# Patient Record
Sex: Female | Born: 1945 | Race: Black or African American | Hispanic: No | Smoking: Former smoker
Health system: Southern US, Community
[De-identification: ages and names within clinical notes are randomized; demographics above are authoritative.]

## PROBLEM LIST (undated history)

## (undated) DIAGNOSIS — I5022 Chronic systolic (congestive) heart failure: Secondary | ICD-10-CM

## (undated) DIAGNOSIS — I10 Essential (primary) hypertension: Secondary | ICD-10-CM

## (undated) DIAGNOSIS — E785 Hyperlipidemia, unspecified: Secondary | ICD-10-CM

## (undated) DIAGNOSIS — F329 Major depressive disorder, single episode, unspecified: Secondary | ICD-10-CM

## (undated) DIAGNOSIS — D259 Leiomyoma of uterus, unspecified: Secondary | ICD-10-CM

## (undated) DIAGNOSIS — I2109 ST elevation (STEMI) myocardial infarction involving other coronary artery of anterior wall: Secondary | ICD-10-CM

## (undated) DIAGNOSIS — H9192 Unspecified hearing loss, left ear: Secondary | ICD-10-CM

## (undated) DIAGNOSIS — I255 Ischemic cardiomyopathy: Secondary | ICD-10-CM

## (undated) DIAGNOSIS — I639 Cerebral infarction, unspecified: Secondary | ICD-10-CM

## (undated) DIAGNOSIS — F32A Depression, unspecified: Secondary | ICD-10-CM

## (undated) HISTORY — DX: Cerebral infarction, unspecified: I63.9

## (undated) HISTORY — PX: TONSILLECTOMY: SUR1361

## (undated) HISTORY — DX: Ischemic cardiomyopathy: I25.5

## (undated) HISTORY — DX: Leiomyoma of uterus, unspecified: D25.9

## (undated) HISTORY — PX: CHOLECYSTECTOMY: SHX55

## (undated) HISTORY — PX: CARDIAC DEFIBRILLATOR PLACEMENT: SHX171

## (undated) HISTORY — PX: ABDOMINAL HYSTERECTOMY: SHX81

## (undated) HISTORY — PX: APPENDECTOMY: SHX54

## (undated) HISTORY — DX: Hyperlipidemia, unspecified: E78.5

## (undated) HISTORY — DX: Essential (primary) hypertension: I10

---

## 1998-06-17 DIAGNOSIS — I2109 ST elevation (STEMI) myocardial infarction involving other coronary artery of anterior wall: Secondary | ICD-10-CM

## 1998-06-17 HISTORY — DX: ST elevation (STEMI) myocardial infarction involving other coronary artery of anterior wall: I21.09

## 2005-05-17 DIAGNOSIS — I255 Ischemic cardiomyopathy: Secondary | ICD-10-CM

## 2005-05-17 HISTORY — DX: Ischemic cardiomyopathy: I25.5

## 2005-05-21 ENCOUNTER — Ambulatory Visit: Payer: Self-pay | Admitting: Internal Medicine

## 2005-05-28 ENCOUNTER — Ambulatory Visit: Payer: Self-pay | Admitting: Internal Medicine

## 2005-05-28 ENCOUNTER — Inpatient Hospital Stay (HOSPITAL_COMMUNITY): Admission: RE | Admit: 2005-05-28 | Discharge: 2005-05-30 | Payer: Self-pay | Admitting: Internal Medicine

## 2005-06-05 ENCOUNTER — Ambulatory Visit: Payer: Self-pay

## 2005-10-01 ENCOUNTER — Ambulatory Visit: Payer: Self-pay | Admitting: Internal Medicine

## 2006-05-19 ENCOUNTER — Ambulatory Visit: Payer: Self-pay

## 2006-08-04 ENCOUNTER — Ambulatory Visit: Payer: Self-pay | Admitting: Internal Medicine

## 2007-02-20 ENCOUNTER — Ambulatory Visit: Payer: Self-pay | Admitting: Internal Medicine

## 2007-05-01 ENCOUNTER — Ambulatory Visit: Payer: Self-pay | Admitting: Cardiovascular Disease

## 2007-05-01 ENCOUNTER — Inpatient Hospital Stay (HOSPITAL_COMMUNITY): Admission: RE | Admit: 2007-05-01 | Discharge: 2007-05-05 | Payer: Self-pay | Admitting: Pediatrics

## 2007-05-01 ENCOUNTER — Encounter (INDEPENDENT_AMBULATORY_CARE_PROVIDER_SITE_OTHER): Payer: Self-pay | Admitting: Pediatrics

## 2007-07-21 ENCOUNTER — Ambulatory Visit: Payer: Self-pay | Admitting: Internal Medicine

## 2007-10-20 ENCOUNTER — Ambulatory Visit: Payer: Self-pay | Admitting: Internal Medicine

## 2008-01-19 ENCOUNTER — Ambulatory Visit: Payer: Self-pay | Admitting: Internal Medicine

## 2008-04-19 ENCOUNTER — Ambulatory Visit: Payer: Self-pay | Admitting: Internal Medicine

## 2008-07-26 ENCOUNTER — Encounter: Payer: Self-pay | Admitting: Internal Medicine

## 2008-08-09 ENCOUNTER — Ambulatory Visit: Payer: Self-pay | Admitting: Internal Medicine

## 2008-11-08 ENCOUNTER — Ambulatory Visit: Payer: Self-pay | Admitting: Internal Medicine

## 2008-12-02 ENCOUNTER — Encounter: Payer: Self-pay | Admitting: Internal Medicine

## 2009-02-24 ENCOUNTER — Encounter: Payer: Self-pay | Admitting: Internal Medicine

## 2009-03-03 ENCOUNTER — Ambulatory Visit: Payer: Self-pay | Admitting: Internal Medicine

## 2009-03-10 ENCOUNTER — Encounter: Payer: Self-pay | Admitting: Internal Medicine

## 2009-06-06 ENCOUNTER — Ambulatory Visit: Payer: Self-pay | Admitting: Internal Medicine

## 2009-06-06 ENCOUNTER — Encounter: Payer: Self-pay | Admitting: Internal Medicine

## 2009-06-19 ENCOUNTER — Telehealth: Payer: Self-pay | Admitting: Internal Medicine

## 2009-06-20 ENCOUNTER — Encounter: Payer: Self-pay | Admitting: Internal Medicine

## 2009-07-21 DIAGNOSIS — I2589 Other forms of chronic ischemic heart disease: Secondary | ICD-10-CM

## 2009-07-21 DIAGNOSIS — I10 Essential (primary) hypertension: Secondary | ICD-10-CM

## 2009-07-21 DIAGNOSIS — E785 Hyperlipidemia, unspecified: Secondary | ICD-10-CM | POA: Insufficient documentation

## 2009-07-21 DIAGNOSIS — I255 Ischemic cardiomyopathy: Secondary | ICD-10-CM | POA: Insufficient documentation

## 2009-07-21 DIAGNOSIS — I2109 ST elevation (STEMI) myocardial infarction involving other coronary artery of anterior wall: Secondary | ICD-10-CM | POA: Insufficient documentation

## 2009-07-24 ENCOUNTER — Ambulatory Visit: Payer: Self-pay | Admitting: Internal Medicine

## 2009-07-24 DIAGNOSIS — Z95810 Presence of automatic (implantable) cardiac defibrillator: Secondary | ICD-10-CM

## 2009-10-24 ENCOUNTER — Ambulatory Visit: Payer: Self-pay | Admitting: Internal Medicine

## 2009-10-27 ENCOUNTER — Encounter: Payer: Self-pay | Admitting: Internal Medicine

## 2009-11-06 ENCOUNTER — Encounter: Payer: Self-pay | Admitting: Internal Medicine

## 2010-01-26 ENCOUNTER — Encounter (INDEPENDENT_AMBULATORY_CARE_PROVIDER_SITE_OTHER): Payer: Self-pay | Admitting: *Deleted

## 2010-01-26 ENCOUNTER — Telehealth: Payer: Self-pay | Admitting: Internal Medicine

## 2010-01-29 ENCOUNTER — Encounter: Payer: Self-pay | Admitting: Internal Medicine

## 2010-01-29 ENCOUNTER — Ambulatory Visit: Payer: Self-pay | Admitting: Internal Medicine

## 2010-02-05 ENCOUNTER — Encounter: Payer: Self-pay | Admitting: Internal Medicine

## 2010-05-03 ENCOUNTER — Ambulatory Visit: Payer: Self-pay | Admitting: Internal Medicine

## 2010-05-30 ENCOUNTER — Encounter: Payer: Self-pay | Admitting: Internal Medicine

## 2010-07-17 NOTE — Letter (Signed)
Summary: Device-Delinquent Phone Journalist, newspaper, Main Office  [address]   [place], [place] [address]   Phone: [phone]  Fax: [phone]     January 26, 2010 MRN: [id number]   [facility] [address] [place], [place]  [address]   Dear Ms. [facility],  According to our records, you were scheduled for a device phone transmission on        01/25/2010.                      .     We did not receive any results from this check.  If you transmitted on your scheduled day, please call us to help troubleshoot your system.  If you forgot to send your transmission, please send one upon receipt of this letter.  Thank you, Altha Harm, LPN  January 26, 2010 10:58 AM   [facility]

## 2010-07-17 NOTE — Letter (Signed)
Summary: Remote Device Check  [employer], Main Office  [address]   [place], [place] [address]   Phone: [phone]  Fax: [phone]     February 05, 2010 MRN: [id number]   [facility] [address] [place], [place]  [address]   Dear Ms. [facility],   Your remote transmission was recieved and reviewed by your physician.  All diagnostics were within normal limits for you.  __X___Your next transmission is scheduled for:  05-03-2010.  Please transmit at any time this day.  If you have a wireless device your transmission will be sent automatically.  ______Your next office visit is scheduled for:                              . Please call our office to schedule an appointment.    Sincerely,  Vella Kohler

## 2010-07-17 NOTE — Cardiovascular Report (Signed)
Summary: Office Visit Remote   Office Visit Remote   Imported By: Roderic Ovens 06/22/2009 12:43:20  _____________________________________________________________________  External Attachment:    Type:   Image     Comment:   External Document

## 2010-07-17 NOTE — Cardiovascular Report (Signed)
Summary: Office Visit Remote   Office Visit Remote   Imported By: Roderic Ovens 02/06/2010 15:52:25  _____________________________________________________________________  External Attachment:    Type:   Image     Comment:   External Document

## 2010-07-17 NOTE — Letter (Signed)
Summary: Remote Device Check  [employer], Main Office  [address]   [place], [place] [address]   Phone: [phone]  Fax: [phone]     Nov 06, 2009 MRN: [id number]   [facility] [address] [place], [place]  [address]   Dear Ms. [facility],   Your remote transmission was recieved and reviewed by your physician.  All diagnostics were within normal limits for you.  __X___Your next transmission is scheduled for:  01-25-2010.  Please transmit at any time this day.  If you have a wireless device your transmission will be sent automatically.   Sincerely,  Vella Kohler

## 2010-07-17 NOTE — Letter (Signed)
Summary: Remote Device Check  [employer], Main Office  [address]   [place], [place] [address]   Phone: [phone]  Fax: [phone]     June 20, 2009 MRN: [id number]   [facility] [address] [place], [place]  [address]   Dear Ms. [facility],   Your remote transmission was recieved and reviewed by your physician.  All diagnostics were within normal limits for you.   ___X___Your next office visit is scheduled for:     February 2011 with Dr Ladona Ridgel. Please call our office to schedule an appointment.    Sincerely,  Proofreader

## 2010-07-17 NOTE — Assessment & Plan Note (Signed)
Summary: 1 yr f/u   Visit Type:  Follow-up Primary Provider:  Charise Killian, MD   History of Present Illness: [name] returns today for followup.  She is a very pleasant middle- aged woman with an ischemic cardiomyopathy, congestive heart failure which has been mostly class I.  She also has a history of ICD implantation.  She had an atrial fibrillation but has not been thought to be a Coumadin candidate and for this reason, she is on Plavix.  She returns today for followup.  She denies chest pain.  She denies shortness of breath.  She does note that she has lost about 5 lbs since I last saw her.   Current Medications (verified): 1)  Plavix 75 Mg Tabs (Clopidogrel Bisulfate) .... Take One Tablet By Mouth Daily 2)  Digoxin 0.125 Mg Tabs (Digoxin) .... Take One Tablet By Mouth Daily 3)  Lotensin Hct 20-12.5 Mg Tabs (Benazepril-Hydrochlorothiazide) 4)  Simvastatin 80 Mg Tabs (Simvastatin) .... Take One Tablet By Mouth Daily At Bedtime 5)  Tramadol Hcl 50 Mg Tabs (Tramadol Hcl) .... 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  -ischemic cardiomyopathy with ejection fraction of 25% in       December 2006 - class I-II congestive heart failure in December       2006.  Hypertension  Dyslipidemia Congestive Heart Failure  Past Surgical History:  ICD  implantation December 2006.      Review of Systems       The patient complains of weight loss.  The patient denies chest pain, syncope, dyspnea on exertion, and peripheral edema.    Vital Signs:  Patient profile:   65 year old female Height:      61 inches Weight:      110 pounds BMI:     20.86 Pulse rate:   95 / minute BP sitting:   110 / 70  (left arm)  Vitals Entered By: [name] CMA (July 24, 2009 2:34 PM)  Physical Exam  General:  Well developed,  well nourished, in no acute distress.  HEENT: normal. Poor dentitittion. Neck: supple. No JVD. Carotids 2+ bilaterally no bruits Cor: RRR with a soft S3.  A 1/6 systolic murmur is present. Lungs: CTA. Well healed ICD incision. Ab: soft, nontender. nondistended. No HSM. Good bowel sounds Ext: warm. no cyanosis, clubbing or edema Neuro: alert and oriented. Grossly nonfocal. affect pleasant     ICD Specifications Following MD:  Lewayne Bunting, MD     ICD Vendor:  Medtronic     ICD Model Number:  [id number]     ICD Serial Number:  [id number] ICD DOI:  05/28/2005      Lead 1:    Location: RV     DOI: 05/28/2005     Model #: 1191     Serial #: [id number]     Status: active  Indications::  ICM  Explantation Comments: LIA Carelink  ICD Follow Up Remote Check?  No Battery Voltage:  2.59 V     Charge Time:  10.16 seconds     Underlying rhythm:  ST ICD Dependent:  No       ICD Device Measurements Right Ventricle:  Amplitude: 15 mV, Impedance: 564 ohms, Threshold: 1.0 V at 0.2 msec Shock Impedance: 17 ohms   Episodes Shock:  0  ATP:  0     Nonsustained:  3     Ventricular Pacing:  0%  Brady Parameters Mode VVI     Lower Rate Limit:  40      Tachy Zones VF:  200     VT:  250     VT1:  171     Next Remote Date:  10/24/2009     Next Cardiology Appt Due:  07/18/2010 Tech Comments:  Normal device function.  No LIA on Onyx.  [phone] lead stable today.  Pt does Carelink transmissions.  ROV 12 months GT. Gypsy Balsam RN BSN  July 24, 2009 2:54 PM  MD Comments:  Agree with above.  Impression & Recommendations:  Problem # 1:  AUTOMATIC IMPLANTABLE CARDIAC DEFIBRILLATOR SITU (ICD-V45.02) Her device is working normally today.  will recheck in several months.  Problem # 2:  ISCHEMIC CARDIOMYOPATHY (ICD-414.8) she denies anginal symptoms.  Continue meds as below. Her updated medication list for this problem includes:    Plavix 75 Mg Tabs (Clopidogrel bisulfate) .Marland Kitchen... Take one tablet by mouth  daily    Digoxin 0.125 Mg Tabs (Digoxin) .Marland Kitchen... Take one tablet by mouth daily    Lotensin Hct 20-12.5 Mg Tabs (Benazepril-hydrochlorothiazide)  Problem # 3:  HYPERTENSION (ICD-401.9) Her blood pressure is well controlled today.  Continue current meds and maintain a low sodium diet. Her updated medication list for this problem includes:    Lotensin Hct 20-12.5 Mg Tabs (Benazepril-hydrochlorothiazide)  Patient Instructions: 1)  Your physician recommends that you schedule a follow-up appointment in: 12 months with Dr Ladona Ridgel

## 2010-07-17 NOTE — Cardiovascular Report (Signed)
Summary: Office Visit Remote   Office Visit Remote   Imported By: Roderic Ovens 11/08/2009 16:52:32  _____________________________________________________________________  External Attachment:    Type:   Image     Comment:   External Document

## 2010-07-17 NOTE — Progress Notes (Signed)
Summary: questions  about meds prescribed by primary  Phone Note Call from Patient Call back at Home Phone [phone]   Caller: Patient  Caregiver -- Karma Lew Reason for Call: Talk to Nurse Summary of Call: Dr. Barbarann Ehlers prescribed Digoxin.  Should she take if her HR is below 60? Initial call taken by: Burnard Leigh,  June 19, 2009 1:50 PM  Follow-up for Phone Call        She is ok to take her meds daily.  If they start to notice HR trending down then hold the next days dose.  She had been on Digoxin before and the aide didnt know why she it was d/c'd.  She will monitor pt and let us know if there are any changes in medications Dennis Bast, RN, BSN  June 19, 2009 2:48 PM

## 2010-07-17 NOTE — Progress Notes (Signed)
Summary: needs to be walked through steps of phone transmission  Phone Note Outgoing Call   Reason for Call: Talk to Nurse Summary of Call: pt didn't do phone transmisson yesterday-going to do today needs someone to walk her through it pls call [phone] sister's cell phone Initial call taken by: Glynda Jaeger,  January 26, 2010 4:21 PM Summary of Call: spoke w/pt's sister---had problems w/transmitter but thinks it needed new batteries.  Pt's sister is going by there today and try to transmit again.  Advised to call if any problems.  Vella Kohler  January 29, 2010 9:10 AM

## 2010-07-19 NOTE — Cardiovascular Report (Signed)
Summary: Office Visit Remote   Office Visit Remote   Imported By: Roderic Ovens 05/31/2010 14:14:38  _____________________________________________________________________  External Attachment:    Type:   Image     Comment:   External Document

## 2010-07-19 NOTE — Letter (Signed)
Summary: Remote Device Check  [employer], Main Office  [address]   [place], [place] [address]   Phone: [phone]  Fax: [phone]     May 30, 2010 MRN: [id number]   [facility] [address] [place], [place]  [address]   Dear Ms. [facility],   Your remote transmission was recieved and reviewed by your physician.  All diagnostics were within normal limits for you.   __X____Your next office visit is scheduled for:  February 2012 with Dr Ladona Ridgel. Please call our office to schedule an appointment.    Sincerely,  Vella Kohler

## 2010-07-24 ENCOUNTER — Encounter: Payer: Medicare Other | Admitting: Internal Medicine

## 2010-08-01 ENCOUNTER — Encounter (INDEPENDENT_AMBULATORY_CARE_PROVIDER_SITE_OTHER): Payer: Medicare Other | Admitting: Internal Medicine

## 2010-08-01 ENCOUNTER — Encounter: Payer: Self-pay | Admitting: Internal Medicine

## 2010-08-01 DIAGNOSIS — I5022 Chronic systolic (congestive) heart failure: Secondary | ICD-10-CM

## 2010-08-01 DIAGNOSIS — I472 Ventricular tachycardia: Secondary | ICD-10-CM

## 2010-08-08 NOTE — Assessment & Plan Note (Signed)
Summary: PC2/DEVICE CHECK/SAF=MJ   Primary Provider:  Charise Killian, MD   History of Present Illness: [name] returns today for followup.  She is a very pleasant middle- aged woman with an ischemic cardiomyopathy, congestive heart failure which has been mostly class I.  She also has a history of ICD implantation.  She had an atrial fibrillation but has not been thought to be a Coumadin candidate and for this reason, she is on Plavix.  She returns today for followup.  She denies chest pain.  She denies shortness of breath. She denies any ICD therapies.  Preventive Screening-Counseling & Management  Alcohol-Tobacco     Smoking Status: quit  Current Medications (verified): 1)  Plavix 75 Mg Tabs (Clopidogrel Bisulfate) .... Take One Tablet By Mouth Daily 2)  Digoxin 0.125 Mg Tabs (Digoxin) .... Take One Tablet By Mouth Daily 3)  Lotensin Hct 20-12.5 Mg Tabs (Benazepril-Hydrochlorothiazide) .... Once Daily 4)  Simvastatin 80 Mg Tabs (Simvastatin) .... Take One Tablet By Mouth Daily At Bedtime 5)  Tramadol Hcl 50 Mg Tabs (Tramadol Hcl) .... Three Times A Day As Needed 6)  Vitamin D 2000 Unit Tabs (Cholecalciferol) .... 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  -ischemic cardiomyopathy with ejection fraction of 25% in       December 2006 - class I-II congestive heart failure in December       2006.  Hypertension  Dyslipidemia Congestive Heart Failure  Past Surgical History: Last updated: 07/24/2009  ICD  implantation December 2006.      Social History: - patient smokes cocaine and also tobacco -  alcohol occasionally. -  lived with her sister who has retardation Tobacco Use - Former. 2009 Smoking Status:  quit  Review of Systems  The patient denies chest pain, syncope, dyspnea on exertion, and peripheral edema.     Vital Signs:  Patient profile:   65 year old female Height:      61 inches Weight:      117 pounds BMI:     22.19 Pulse rate:   65 / minute BP sitting:   98 / 60  (right arm) Cuff size:   regular  Vitals Entered By: Hardin Negus, RMA (August 01, 2010 4:44 PM)  Physical Exam  General:  Well developed, well nourished, in no acute distress.  HEENT: normal. Poor dentitittion. Neck: supple. No JVD. Carotids 2+ bilaterally no bruits Cor: RRR with a soft S3.  A 1/6 systolic murmur is present. Lungs: CTA. Well healed ICD incision. Ab: soft, nontender. nondistended. No HSM. Good bowel sounds Ext: warm. no cyanosis, clubbing or edema Neuro: alert and oriented. Grossly nonfocal. affect pleasant     ICD Specifications Following MD:  Lewayne Bunting, MD     ICD Vendor:  Medtronic     ICD Model Number:  [phone]     ICD Serial Number:  [id number] ICD DOI:  05/28/2005      Lead 1:    Location: RV     DOI: 05/28/2005     Model #: 1191     Serial #: [id number]     Status: active  Indications::  ICM  Explantation Comments: LIA Carelink  ICD Follow Up ICD Dependent:  No      Brady Parameters Mode VVI     Lower Rate Limit:  40  Tachy Zones VF:  200     VT:  250     VT1:  171     MD Comments:  Normal device function.  Impression & Recommendations:  Problem # 1:  AUTOMATIC IMPLANTABLE CARDIAC DEFIBRILLATOR SITU (ICD-V45.02) Her device is working normally. Will recheck in several months.  Problem # 2:  ISCHEMIC CARDIOMYOPATHY (ICD-414.8) She denies anginal symptoms. Continue current meds. Her updated medication list for this problem includes:    Plavix 75 Mg Tabs (Clopidogrel bisulfate) .Marland Kitchen... Take one tablet by mouth daily    Digoxin 0.125 Mg Tabs (Digoxin) .Marland Kitchen... Take one tablet by mouth daily    Lotensin Hct 20-12.5 Mg Tabs (Benazepril-hydrochlorothiazide) ..... Once daily  Problem # 3:  HYPERTENSION (ICD-401.9) Her blood pressure today is well controlled. Continue current  meds. Her updated medication list for this problem includes:    Lotensin Hct 20-12.5 Mg Tabs (Benazepril-hydrochlorothiazide) ..... Once daily  Patient Instructions: 1)  Your physician wants you to follow-up in:12 months with Dr Court Joy will receive a reminder letter in the mail two months in advance. If you don't receive a letter, please call our office to schedule the follow-up appointment. 2)  Your physician recommends that you continue on your current medications as directed. Please refer to the Current Medication list given to you today.

## 2010-08-14 NOTE — Cardiovascular Report (Signed)
Summary: Office Visit   Office Visit   Imported By: Roderic Ovens 08/08/2010 15:55:00  _____________________________________________________________________  External Attachment:    Type:   Image     Comment:   External Document

## 2010-10-30 NOTE — Assessment & Plan Note (Signed)
[facility]                         ELECTROPHYSIOLOGY OFFICE NOTE   DEMARI, KROPP                       MRN:          [id number]  DATE:07/21/2007                            DOB:          21-Jun-1945    Ms. Bergh returns today for followup.  She is a very pleasant 65-year-  old woman with a history of ischemic cardiomyopathy, congestive heart  failure and very significant medical noncompliance who underwent ICD  implantation in the past.  Unfortunately, she sustained a stroke several  months ago and had an extensive hospitalization.  She returns today for  followup.  Except for her severe neurologic deficit, she has been  stable.  She denies shortness of breath or other heart failure symptoms.  She is wheelchair bound secondary to bilateral lower extremity weakness.  She also does not move her left arm.   MEDICATIONS:  1. Plavix 75 a day.  2. Simvastatin 40 a day.  3. Digoxin 0.125 daily.  4. Benazepril 20 mg daily.   PHYSICAL EXAMINATION:  GENERAL:  She is a pleasant middle-age woman in  no acute distress.  VITAL SIGNS:  The blood pressure was 140/87. The pulse was 90 and  regular.  The respirations were 18.  Weight was not recorded secondary  to her wheelchair dependence.  NECK:  Revealed no jugular distention.  LUNGS:  Clear bilaterally to auscultation.  No wheezes, rales or rhonchi  are present.  CARDIOVASCULAR:  Exam revealed regular rate and rhythm with normal S1  and S2. There is a soft S4 gallop.  EXTREMITIES:  Demonstrated no edema.  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   Interrogation of her defibrillator demonstrates Medtronic Onyx.  The R  waves are 20, the impedance 41, the threshold volt of 0.2. The battery  voltage 2.93 volts.  There are no intercurrent intracoronary therapies.   IMPRESSION:  1. Ischemic cardiomyopathy.  2. Status post implantable  cardioverter-defibrillator insertion.  3. Paroxysmal atrial fibrillation.  4. Stroke secondary to #3 with patient not being a Coumadin candidate      secondary to history of noncompliance.   DISCUSSION:  Overall, Ms. Ra is stable despite her very severe  neurologic deficits.  Her defibrillator is working normally.  Her heart  failure is well controlled.  We will see her back in the office for ICD  followup in a year.     Doylene Canning. Ladona Ridgel, MD  Electronically Signed    GWT/MedQ  DD: 07/21/2007  DT: 07/21/2007  Job #: [id number]

## 2010-10-30 NOTE — Discharge Summary (Signed)
NAME:  [name], [name]              ACCOUNT NO.:  [id number]   MEDICAL RECORD NO.:  [id number]          PATIENT TYPE:  INP   LOCATION:  3736                         FACILITY:  [facility]   PHYSICIAN:  Pramod P. Pearlean Brownie, MD    DATE OF BIRTH:  08-Aug-1945   DATE OF ADMISSION:  05/01/2007  DATE OF DISCHARGE:  05/05/2007                               DISCHARGE SUMMARY   DIAGNOSIS AT TIME OF DISCHARGE:  1. Right middle cerebral artery infarct, subacute, with hemorrhagic      transformation.  2. Ischemic cardiomyopathy.      a.     May 28, 2005 status post Medtronic VR [id number] single-       chamber AICD.      b.     October 28, 2-D echocardiogram in [place] with ejection       fraction of 15-20%, probable  left ventricular thrombus.  3. Congestive systolic congestive heart failure.  4. Nonsustained ventricular tachycardia.  5. Hypertension.  6. Hyperlipidemia.  7. Polysubstance abuse.  8. History of left brain stroke.  9. History of right middle cerebral artery stroke in April 10, 2007      in the setting of cocaine.  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   MEDICINES AT TIME OF DISCHARGE:  1. Multivitamin a day.  2. Zocor 40 mg a day.  3. Altace 5 mg a day.  4. Digoxin 0.125 mg a day.  5. Depakote 250 mg b.i.d.  6. Plavix 75 mg a day.  7. Pepcid 20 mg a day.  8. Coreg 6.25 mg b.i.d.  9. Sliding-scale insulin.  10.Klonopin 0.5 mg b.i.d.  11.Seroquel 50 mg a day.   STUDIES PERFORMED:  1. CT of the brain performed at [facility] shows subacute      stroke in the right middle cerebral artery territory with some      petechial blood products but no frank hematoma, old stroke in left      MCA territory.  2. Carotid Doppler shows no ICA stenosis.  Vertebral artery flow      antegrade bilaterally.  3. A 2-D echocardiogram shows EF of 20-25% with diffuse left      ventricular hypokinesis with regional variations but no diagnostic  regional wall motion abnormalities.  No cardiac source of embolus.  4. EKG shows normal sinus rhythm with sinus arrhythmia, possible left      atrial enlargement, anteroseptal infarct, age undetermined.   LABORATORY STUDIES:  CBC with hemoglobin 11.9, hematocrit 35.2,  otherwise normal.  Chemistry with glucose 106, otherwise normal.  Coagulation studies normal.  Liver function tests with AST 41, otherwise  normal, albumin 3.5.  Troponin 0.08 and 0.07, CK 331 and 287, CK-MB 5.9  and 4.7.  Cholesterol 238, triglycerides 77, HDL 66, LDL 157.  Hemoglobin A1c 5.5.  Homocystine 11.8.   HISTORY OF PRESENT ILLNESS:  [name] is a 65 year old African-  American female who was transferred from [facility] emergency room as a  local neurologist was unwilling to admit the patient because he had no  neurosurgery backup available.  The patient had been transferred from a  rehab center to the [facility] emergency room because the patient was  unmanageable at the rehab center.  She was tried to get out of bed and  falling because of severe left hematemesis and partial neglect.  She was  also banging her head on rails and wall.  The patient was evaluated in  the ER.  Her CT scan of the brain showed hemorrhagic transformation  along with cortical margins and a prior nonhemorrhagic infarction.  Although this was within the borders of the previous stroke, the local  neurologist refused to take the patient in the hospital.  After multiple  attempts to place the patient throughout the region, it was agreed the  [facility] would accept her for evaluation.   The patient was admitted to [facility] April 10, 2007  with altered mental status, nausea and vomiting.  CT of the brain there  showed a remote left frontal parietal infarct that had been seen  previously on the hospitalization in May 2008.  At that time, the  patient was sedated for procedures and unfortunately developed some 63   extreme somnolence and low oxygen saturations.  She was placed in the  intensive care unit.  Over the next several days, there was evolution of  the right frontal parietal infarct with hemorrhagic transformation of  basal ganglia and mass effect.  She remained hospitalized in [place]  until November 7, 63.   During that time, she developed severe  dysphasia and was thought to have Candida esophagitis and was treated.  She had a 2-D echocardiogram which showed a dilated left atrium,  thickened mitral valvular leaflets, left ventricular segmental wall  abnormality.  Ejection fraction was 15-20%, probable thrombus in the  left ventricle apex.  The patient had been noncompliant on Coumadin in  the past and was a known cocaine user.  Because of this and the  hemorrhagic transformation and infarction, she was not restarted on  Coumadin.   She was admitted to [facility] for further evaluation.  She was  not a TPA candidate secondary no acute stroke.   HOSPITAL COURSE:  CT of the brain performed did reveal stroke present  from [facility] October 2008 with minimal hemorrhagic transformation.  This hemorrhagic transformation was not a hematoma and was expected  evolution of the current stroke.  She had no neurologic deficits.  She  apparently had some behavioral problems at the nursing home, but these  have also resolved somewhat in the hospital.  The patient was initially  restrained, but once became more oriented, restraints were removed.   Cardiology was consulted for elevated cardiac enzymes and injection  fraction of 25%.  They felt the enzymes were insignificant.  They  reviewed her history and agreed from a cardiac perspective that the  enzymes elevated were likely secondary to stroke and that patient was  probably best treated with Coumadin, though definitely not a good  Coumadin candidate secondary to compliance and cocaine use.  They  recommended Plavix if neuro agrees.  Her  medications were changed from  Lopressor to Coreg.  She was stable from a cardiac standpoint, and they  signed off.   From a neurologic standpoint, the patient was placed on aspirin during  hospital stay.  Okay for standpoint to change to Plavix and can do that  at discharge. As the patient had no new acute stroke needs, there was  nothing new to do during  the hospitalization.  She was seen by PT and  OT, and therapy was continued here, and they all agreed for continuation  of therapy at skilled nursing facility.  The facility the patient was at  prior to admission would not accept her back, and FL-2 was faxed out,  and a new place was found.  Once bed was available, the patient was  ready for discharge.   CONDITION ON DISCHARGE:  The patient alert and oriented to person and  place.  She follows simple commands.  She has a dense left hematemesis,  and she moves her right side well.   DISCHARGE/PLAN:  1. Discharge to skilled nursing facility in [place].  2. Continue aggressive risk factor control.  3. Follow up with primary care M.D. or M.D.  at nursing home within      the next month.  4. Follow up with neurologist in [facility] for follow-up care.  5. Plavix for secondary stroke prevention.      Annie Main, N.P.    ______________________________  Sunny Schlein. Pearlean Brownie, MD    SB/MEDQ  D:  05/05/2007  T:  05/05/2007  Job:  [id number]   cc:   Pramod P. Pearlean Brownie, MD  Doylene Canning. Ladona Ridgel, MD

## 2010-10-30 NOTE — H&P (Signed)
NAME:  [name], [name] NO.:  [id number]   MEDICAL RECORD NO.:  [id number]          PATIENT TYPE:  INP   LOCATION:  3733                         FACILITY:  [facility]   PHYSICIAN:  Deanna Artis. Hickling, M.D.DATE OF BIRTH:  12-02-1945   DATE OF ADMISSION:  05/01/2007  DATE OF DISCHARGE:                              HISTORY & PHYSICAL   CHIEF COMPLAINT:  Falling, problems with behavior, hemorrhagic  transformation of subacute infarction.   HISTORY OF PRESENT ILLNESS:  The patient is transferred from [facility]  Emergency Room.  Call placed at 2345 hours.  The local neurologist was  unwilling to admit the patient because no neurosurgery backup was  available.  The patient had been transferred from a rehab facility to  [facility] because the patient was unmanageable at the rehab  center.  She was trying to get out of bed and falling because of severe  left hemiparesis and partial neglect.  She was also banging her head on  the rails and wall.   The patient was evaluated in the ER.  The patient had a CT scan of the  brain which showed hemorrhagic transformation along the cortical margins  of a prior non-hemorrhagic infarction.  Though this was within the  borders of the previous stroke, the local neurologist refused to take  the patient in the hospital.  After multiple attempts to place the  patient throughout the region, I agreed to take the patient for  evaluation.  Only the emergency room information was sent.  The patient  was unable to give details concerning her medications and past history.  At least the information was sent for history of present illness,  consults and discharge summaries which provide the rest of the  information here.   The patient was admitted to [facility] April 10, 2007 with  altered mental status, nausea and vomiting.  Initial CT scan showed a  remote left parietal, frontoparietal infarction that had been seen  previously on a  brief hospitalization in May 2008.   The patient was sedated for procedures and unfortunately developed  extreme somnolence and low oxygen saturation.  She was admitted to the  intensive care unit.  Over the next several days, there was evolution  for right frontoparietal infarction with hemorrhagic transformation of  the basal ganglia and mass effect.  The patient remained in the hospital  until April 24, 2007.  During that time, she had severe dysphagia and  was thought to have Candida esophagitis which was treated.  She had a 2-  D echocardiogram which showed a dilated left atrium, thickened mitral  valvular leaflets, left ventricular segmental wall abnormality.  Ejection fraction of 15-20% and probable thrombus in the left  ventricle apex.  The patient had been noncompliant on Coumadin in the  past and was a known cocaine user.  Because of this and the hemorrhagic  transformation of infarction,  she was not restarted on Coumadin.   PAST MEDICAL HISTORY:  1. The patient had an anterior wall myocardial infarction in 2002 in      the setting of cocaine.  2. She had ischemic cardiomyopathy with ejection fraction of 25% in      December 2006 and class I-II congestive heart failure in December      2006.  She had implantation of defibrillator at that time.  The      last time this was interrogated was August 04, 2006.  Dr. Sharrell Ku was the physician responsible for implantation and      maintenance of the defibrillator.  3. She also has hypertension.  4. Dyslipidemia.  5. As noted above, previous left brain stroke.  Date is unknown, but      presumably between 2002 and the present.   PAST SURGICAL HISTORY:  Pacemaker implantation December 2006.   MEDICATIONS:  1. Aspirin 81 mg.  2. Zocor 40 mg.  3. Altace 5 mg.  4. Lopressor 12.5 mg twice daily.  5. Lanoxin 0.125 mg.  6. Depakote 250 mg twice daily.  7. The patient was on Zyprexa at some point during the April 10, 2007 to April 24, 2007  hospitalization.   DRUG ALLERGIES:  NONE KNOWN.   FAMILY HISTORY:  Mother had a cerebrovascular accident at age 2.  Sister has mental retardation.   SOCIAL HISTORY:  The patient smokes cocaine and also tobacco.  She uses  alcohol occasionally.  She lived with her sister who has retardation.  She very much wanted to go home at rehab and was not cooperative.   REVIEW OF SYSTEMS:  Currently she complains of pain in her abdomen, arms  and her head.  No other complaints on 12-system review.   PHYSICAL EXAMINATION:  VITAL SIGNS:  Temperature 98, resting pulse 92,  blood pressure 144/82, respirations 20, oxygen saturation 96%.  EARS/NOSE/THROAT:  No bruits.  No infections.  LUNGS:  Clear.  HEART:  Shows an aortic murmur.  Pulses are normal.  The patient has a  pacemaker in the left upper chest.  ABDOMEN:  Soft.  Bowel sounds normal.  No hepatosplenomegaly.  EXTREMITIES:  Negative.  NEUROLOGIC:  NIH stroke scale was 13 and modified Rankin is 5.  She is  right-handed.  She has no dysphasia or dysarthria.  CRANIAL NERVES:  Round and reactive pupils.  Fundi normal.  Visual  fields full to counting fingers.  She may extinguish to double  simultaneous stimuli.  She has a left central seventh.  She has a  midline tongue.  She passed her swallowing screen.  MOTOR:  Flaccid left plegia.  Normal strength in the right arm and leg.  She has  clumsiness of her fine motor movements.  No drift.  SENSORY:  Decreased on the left.  She has astereognosis.  She does not  neglect the left arm, but extinguishes to double simultaneous stimuli.  CEREBELLAR:  Good finger-to-nose, heel-knee-shin on the right side.  Deep tendon reflexes show a left reflex predominance proximally and  absent distally.  The patient has a right flexor and left extensor  plantar response.   IMPRESSION:  1. Subacute right frontoparietal infarction with further hemorrhagic      transformation  compared  with April 19, 2007 computed tomography      scan.  2. Remote left central artery branch occlusion with frontoparietal and  nonhemorrhagic infarction.  1. Suspected cardiogenic emboli for both due to her low ejection      fraction.  2. Implanted defibrillator.  3. Cocaine abuse.  4. Definite fall risks.   PLAN:  1. We will repeat the CT scan of the brain to evaluate evolution.  The      old CT scans are on the chart and the CD-ROM, but are very hard to      access and are somewhat motion degraded.  2. Cardiology consult with Dr. Lewayne Bunting for left ventricular      clot and also to  interrogate the defibrillator.  He has not been      called to date.  3. Noninvasive vascular workup.  4. Laboratory workup all looking for treatable causes of stroke.  5. We will not restart Coumadin because of her use of cocaine, her      noncompliancy for fall risk and her bleed which has increased since      April 19, 2007.  It is really just a natural manifestation of      reorganization of the brain.  6. We will consult rehab for chronic inpatient rehabilitation.  This      will depend upon whether or not she will be placed back at home      which is doubtful.   LABORATORY DATA:  From [place]:  Urinalysis:  Specific gravity 1.020,  pH 6, 5-10 white blood cells, no leukocyte esterase or nitrate, PT 10.3,  INR 1.0.  Tox screen positive for barbiturates. Negative for cocaine.  Negative for alcohol.  Sodium 136, potassium 4.2, chloride 101, CO2 27,  BUN 20, creatinine 0.91, GFR is greater than 60, CK 248, MB 5, troponin  0.09.  Digoxin level 0.88.  Valproic acid level 23.  White blood cell count  6300, hemoglobin 10.6, hematocrit 36.6, platelet count 275,000, 63  polys, 27 lymphs, 9 monos, 1 eosinophil.  Absolute neutrophil count  9000.   DIAGNOSTICS:  Serial EKGs, 2 from April 27, 2007 and 1 from April 30, 2007, suggested an acute anterior infarction.  We will perform  serial  enzymes.   Time spent 3:30 a.m. to 6:20 a.m. evaluating the patient, reviewing her  records and planning for care.      Deanna Artis. Sharene Skeans, M.D.  Electronically Signed     [facility]/MEDQ  D:  05/01/2007  T:  05/01/2007  Job:  [id number]

## 2010-10-30 NOTE — Assessment & Plan Note (Signed)
[facility]                         ELECTROPHYSIOLOGY OFFICE NOTE   DEYNA, CARBON                       MRN:          [id number]  DATE:08/09/2008                            DOB:          [birth year]    Ms. Sattar returns today for followup.  She is a very pleasant middle-  aged woman with an ischemic cardiomyopathy, congestive heart failure  which has been mostly class I.  She also has a history of ICD  implantation.  She had an atrial fibrillation but has not been thought  to be a Coumadin candidate and for this reason, she is on Plavix.  She  returns today for followup.  She denies chest pain.  She denies  shortness of breath.  She had no other specific complaints today.   CURRENT MEDICATIONS:  1. Plavix 75 mg a day.  2. Simvastatin 40 mg a day.  3. Digoxin 0.125 mg daily.  4. Benazepril 20 mg a day.  5. HCTZ 12.5 mg daily.   PHYSICAL EXAMINATION:  GENERAL:  She is a pleasant middle-aged woman in  no acute distress.  VITAL SIGNS:  The blood pressure today was 112/80, the pulse was 90 and  regular, respirations were 18, the weight was 112 pounds.  NECK:  No jugular venous distention.  LUNGS:  Clear to bilateral auscultation.  No wheezes, rales, or rhonchi  present.  There is no increased work of breathing.  CARDIOVASCULAR:  Regular rate and rhythm.  Normal S1 and S2.  There is a  soft S3 gallop present.  The PMI is enlarged and laterally displaced.  ABDOMEN:  Soft, nontender.  EXTREMITIES:  No edema.   Interrogation of her defibrillator demonstrates a Medtronic Onyx.  The R-  waves were greater than 15, the impedance 564, the threshold was of volt  0.2.  Battery voltage was 2.64 volts.  She was 99% V sensed.   IMPRESSION:  1. Ischemic cardiomyopathy.  2. Congestive heart failure.  3. Status post implantable cardioverter-defibrillator insertion.   DISCUSSION:  Overall, Ms. Goettel is stable.  Her defibrillator is  working  normally.  Her heart failure is well controlled.  At this point,  I have recommend continuation of her current medical  therapy.  I recommend that we see her back in the office for ICD  followup in 1 year.  We will plan to do this in our [place] location  to help minimize her driving distance.     Doylene Canning. Ladona Ridgel, MD  Electronically Signed    GWT/MedQ  DD: 08/09/2008  DT: 08/10/2008  Job #: [id number]

## 2010-10-30 NOTE — Consult Note (Signed)
[name], [name] NO.:  [id number]   MEDICAL RECORD NO.:  [id number]          PATIENT TYPE:  INP   LOCATION:  3733                         FACILITY:  [facility]   PHYSICIAN:  Tiffany Pick. Eden Emms, MD, FACCDATE OF BIRTH:  1945/07/25   DATE OF CONSULTATION:  05/01/2007  DATE OF DISCHARGE:                                 CONSULTATION   PRIMARY CARDIOLOGIST:  Dr. Gilman Trujillo.   PRIMARY CARE PHYSICIAN:  In [place], [place].   PATIENT PROFILE:  A 66 year old African female with prior history of  CAD, ischemic cardiomyopathy, chronic heart failure, status post ICD,  who presents with subacute of right MCA territory CVA, with elevated  cardiac markers.   PROBLEM LIST:  1. CAD.      a.     Status post anterior MI 2002 in the setting of cocaine use.  2. Ischemic cardiomyopathy.      a.     May 28, 2005, status post Medtronic Onyx VR 7290 VVI       single-chamber AICD.      b.     October 2008, 2D echocardiogram in [place], [place] 15%-20%,       probable LV thrombus.  3. Chronic systolic congestive heart failure.  4. Nonsustained ventricular tachycardia.  5. Hypertension.  6. Hyperlipidemia.  7. Polysubstance abuse.  8. Prior left brain CVA noted on CT.  9. Right MCA CVA April 10, 2007, in the setting of cocaine.  10.LV thrombus noted on echo October 8.   HISTORY OF PRESENT ILLNESS:  A 65 year old African-American female with  history of CAD, ischemic cardiomyopathy, chronic CHF.  She was admitted  to [facility] October 24 with CVA and hemorrhagic conversion, in the  setting of cocaine usage.  Workup included 2-D echocardiogram revealing  an EF of 15% to 20%, questionable LV thrombus, and she was evaluated by  cardiology, and the decision was made not to pursue a Coumadin  anticoagulation secondary to noncompliance and hemorrhagic CVA.  She is  discharged to rehab and apparently has been frequently agitated, with  frequent falls including yesterday.  Apparently,  because of her frequent  agitation, the rehab facility sent her back to the [facility] ED for  potential psych evaluation.  There, decision was made to transfer her to  [facility] for further neurologic evaluation, and here a CT was performed  showing acute to subacute stroke in the left MCA territory, no gross  hematoma.  There is old left MCA territory CVA.  Also notable is that  her cardiac markers showed a CK of 331, MB of 5.9, troponin-I 0.08.  We  were asked to evaluate, secondary to prior cardiac history of elevated  markers at this time.  She denies any chest pain or shortness of breath,  now or prior to admission.   ALLERGIES:  SHRIMP.   CURRENT MEDICATIONS:  1. Aspirin 325 mg daily.  2. Digoxin 0.125 mg daily.  3. Depakote 250 mg b.i.d.  4. Lovenox 40 mg daily.  5. Pepcid 20 mg daily.  6. Lopressor 12.5 mg b.i.d.  7. Multivitamin daily.  8. Altace 5  mg daily.   FAMILY HISTORY:  Mother died of CVA at 93.  Father died of unknown  causes at 64.  She has a brother and sister, both of which are alive and  well.   SOCIAL HISTORY:  She lives [place] and currently in rehab.  She has  been smoking for about 20 packs a day.  It is not clear how many  cigarettes a day she smokes, but says she has not had anything since her  stroke in October.  She said, previously, she would occasionally drinks  wine and was a cocaine abuser, last using prior to a stroke in October  2008.   REVIEW OF SYSTEMS:  Apparently, she has had some agitation at her rehab  facility.  She has left-sided weakness and numbness/hemiparesis.  Otherwise, all systems reviewed are negative.   PHYSICAL EXAM:  VITAL SIGNS:  Temperature 98.8, heart rate 92,  respirations 20, blood pressure 144/82, pulse ox 96% on room air.  GENERAL:  Pleasant African female, no acute distress.  She has left  facial droop.  NECK:  Without bruits or JVD.  LUNGS:  Respirations are regular, unlabored.  CARDIAC:  Regular S1, S2, positive  S4, no murmurs.  ABDOMEN:  Round, soft, nontender, nondistended.  Bowel sounds present  x4.  EXTREMITIES:  Warm, dry, no clubbing, cyanosis or edema.  Dorsalis  pedis, posterior tibial pulses 2+ and equal bilaterally.   ACCESSORY CLINICAL FINDINGS:  Head CT as outlined in the HPI.  EKG shows  sinus rhythm with a normal axis, anterior Qs and T-wave inversion in the  lateral leads.   LAB WORK:  Hemoglobin 11.9, hematocrit 35.2, WBCs 4.9, platelets 278,  sodium 139, potassium 4.0, chloride 104, BUN 14, creatinine 0.93,  glucose 106, total bilirubin 0.8, alkaline phosphatase 66, AST 41, ALT  28, CK 331, MB 5.9, troponin-I 0.08, calcium 10.0, total protein 6.9,  albumin 3.5, INR 0.9, total cholesterol 238, triglycerides 77, HDL 66,  LDL 157.   ASSESSMENT/PLAN:  1. Acute to subacute right MCA cerebrovascular accident, per      neurology.  2. Coronary artery disease.  The patient presents with elevated      cardiac markers in the setting of acute to subacute cerebrovascular      accident.  She denies any chest pain or shortness of breath at this      time.  ECG showed old anterior Qs.  At this point, I would not      pursue additional cardiac ischemic evaluation, however; would      continue to trend cardiac markers.  Continue aspirin ACE inhibitor.      Would strongly consider initiation of Statin therapy.  The patient      would be compliant.  With prior cocaine abuse, would have to      consider discontinuation of beta blocker; however, if she remains      institutionalized thus making access to cocaine slim to none, would      continue beta blocker switch to Carvedilol.  3. Ischemic cardiomyopathy/chronic systolic congestive heart failure.      The patient is euvolemic on exam.  Would switch beta blocker to      Carvedilol; however, if patient were discharged to home at some      point, would have to strongly consider discontinuation of beta      blocker at altogether.  Continue ACE  inhibitor and digoxin.  4. History nonsustained ventricular tachycardia.  The patient is  status post ICD and has followup in our office with Dr. Ladona Ridgel.  5. Hypertension.  Blood pressure is currently elevated.  Heart rate      has also appeared as noted above.  We will change her Lopressor to      Coreg and can titrate this as necessary.  6. Reduce complete cessation of tobacco, alcohol and drugs as advised.  7. LV thrombus.  Could consider TEE to confirm; however, the patient      is not a Coumadin candidate secondary to prior history      noncompliance, with hemorrhagic cerebrovascular accident.      Nicolasa Ducking, ANP      Tiffany Pick. Eden Emms, MD, [facility]  Electronically Signed    CB/MEDQ  D:  05/01/2007  T:  05/02/2007  Job:  [id number]

## 2010-11-02 NOTE — Op Note (Signed)
NAMEJACEE, [name] NO.:  [id number]   MEDICAL RECORD NO.:  [id number]          PATIENT TYPE:  INP   LOCATION:  2899                         FACILITY:  [facility]   PHYSICIAN:  Doylene Canning. Ladona Ridgel, M.D.  DATE OF BIRTH:  May 14, 1946   DATE OF PROCEDURE:  05/28/2005  DATE OF DISCHARGE:                                 OPERATIVE REPORT   PROCEDURE PERFORMED:  Implantation of single chamber implantable  cardioverter-defibrillator.   INDICATIONS:  Ischemic cardiomyopathy, status post MI, with severe LV  dysfunction, EF of 25%.   INJECTION:  The patient is a very pleasant middle-aged woman with a history  of anterior myocardial infarction back in 2002.  Despite this she has done  quite well with maximal medical therapy in terms of being free of congestive  heart failure symptoms.  The patient is now referred for prophylactic ICD  insertion (MADIT-2).   PROCEDURE:  After informed consent was obtained, the patient was taken to  the diagnostic EP lab in a fasting state.  After the usual preparation and  draping, intravenous fentanyl and midazolam were given for sedation.  Lidocaine 30 mL was infiltrated into the left infraclavicular region.  An 8  cm incision was carried out over this region and electrocautery utilized to  dissect down to the fascial plane.  The left subclavian vein was punctured  in the extrathoracic portion and the Medtronic model 6949 58-cm active-  fixation defibrillation lead., serial number [id number], was advanced into  the right ventricle.  Mapping was carried out and at the final site on the  inferior apical portion of the RV septum, the R-waves measured 10 mV and the  pacing impedance was 801 ohms with the lead actively fixed.  The pacing  threshold was 0.5 volts of 0.5 msec.  With the defibrillation lead in  satisfactory position, it was secured to the subpectoralis fascia with a  figure-of-eight silk suture.  The sewing sleeve was also secured with  silk  suture.  Electrocautery was then utilized to make a subcutaneous pocket.  Kanamycin irrigation was utilized to irrigate the pocket and electrocautery  utilized to assure hemostasis.  The Medtronic Onyx VR model 7290 single-  chamber defibrillator, serial number [id number], was connected to the  defibrillation lead and placed in the subcutaneous pocket.  The generator  was secured with silk suture.  Additional kanamycin was utilized to irrigate  the pocket and defibrillation threshold testing then carried out.   After the patient was deeply sedated with fentanyl and Versed, VF was  induced with a T-wave shock.  A 15-joule shock was delivered, which  terminated ventricular fibrillation and restored sinus rhythm.  Five minutes  were allowed to elapse and a second DFT test carried out.  Again VF was  induced with a T-wave shock and a 10-joule shock was then delivered, which  terminated VF and restored sinus rhythm.  At this point no additional  defibrillation threshold testing was carried out and the incision was closed  with a layer of 2-0 Vicryl, followed by a layer of 3-0 Vicryl, followed by a  layer  of 4-0 Vicryl.  Benzoin was painted on the skin and Steri-Strips were  applied and a pressure dressing was placed, and the patient was returned to  her room in satisfactory condition.   COMPLICATIONS:  There were no immediate the procedural complications.   RESULTS:  This demonstrates successful implantation of a Medtronic single-  chamber defibrillator in a patient with an ischemic cardiomyopathy status  post large anterior myocardial infarction and an ejection fraction of 25%.           ______________________________  Doylene Canning. Ladona Ridgel, M.D.     GWT/MEDQ  D:  05/28/2005  T:  05/29/2005  Job:  [id number]   cc:   Justice Britain, M.D.  [place], [place]

## 2010-11-02 NOTE — Discharge Summary (Signed)
[name], [name] NO.:  [id number]   MEDICAL RECORD NO.:  [id number]          PATIENT TYPE:  INP   LOCATION:  6529                         FACILITY:  [facility]   PHYSICIAN:  Doylene Canning. Ladona Ridgel, M.D.  DATE OF BIRTH:  [birth year]   DATE OF ADMISSION:  05/28/2005  DATE OF DISCHARGE:  05/30/2005                                 DISCHARGE SUMMARY   PRINCIPAL DIAGNOSIS:  1.  Discharging day two status post implantation of Medtronic VVI ICD.  2.  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  3.  Ischemic cardiomyopathy, ejection fraction persistently at 25%.  4.  Class I to class II congestive heart failure.  5.  Postop chest pain, echocardiogram December 12 negative for pericardial      effusions, medications for musculoskeletal chest pain given.      Electrocardiogram also negative for acute myocardial infarction as are      her enzymes.  The patient remained in hospital 24 hours with enzymes      cycling to make sure.  6.  Postoperative exacerbation of chronic bronchitis with nausea, vomiting,      and thick yellowish secretions post procedure day two, with concurrent      nausea, treated with Z-Pack and Guaifenesin to continue as an      outpatient.   ALLERGIES:  Shrimp.   PROCEDURE:  May 28, 2005, implantation of VVI cardioverter  defibrillator by way of the left subclavian vein, no immediate  complications,  defibrillator threshold study less than or equal to 10 joules, this was a  Medtronic device.   BRIEF HISTORY:  [name] is a 65 year old female who sustained a  myocardial infarction in 2002.  She had severe left ventricular dysfunction  as a result.  This has persisted.  Her heart failure symptoms are class I  and II and she does very well.  She is now referred for prophylactic  implantable cardioverter defibrillator.  The patient denies history of  syncope.  She does not have ongoing chest pain, very  minimal dyspnea with  exertion.  The procedure will be done electively after description of the  risks and benefits have been given to the patient.   HOSPITAL COURSE:  The patient presented electively December 12.  Implantation of the defibrillator proceeded the same day without difficulty.  This was a Medtronic device VVI implantation.  Chest x-ray the day after the  procedure showed the lead is in the appropriate position.  The patient has  been off Coumadin coming into this procedure and will restart on December 12  to continue on her daily regimen.  Soon after the procedure, the patient is  experiencing chest pain.  Echocardiogram was obtained by bedside which was  negative for pericardial effusion.  Electrocardiogram and initial cardiac  enzymes were also non-demonstrative of acute myocardial infarction.  The  patient, however, was kept under observation for a further 24 hours with  enzyme cycling and on the morning of post procedure day two, had nausea and  vomiting with  thick bronchial secretions, yellowish to yellow-green in  color.  The patient was started on Z-Pack, Zithromax, Guaifenesin, given  Zofran for nausea with some improvement.  The patient is discharged the same  day with outpatient Zithromax, Phenergan, and Guaifenesin in addition to her  regular medications.   DISCHARGE MEDICATIONS:  1.  Percocet 5/325 to take carefully in the setting of nausea.  2.  Furosemide 20 mg daily.  3.  Benazepril 10 mg daily.  4.  Coumadin 10 mg Thursday, Saturday, Sunday, and 7.5 mg Monday, Tuesday,      and Wednesday.  5.  Lovastatin 40 mg daily.  6.  Digoxin 0.25 mg daily.  7.  Aspirin 81 mg daily.   DISCHARGE INSTRUCTIONS:  The patient is asked to keep her incision dry for  the next seven days, to sponge bathe until Tuesday, December 19.  To lift  nothing heavier than 10 pounds for the next four weeks and not to drive for  the next week.  Discharge diet low sodium, low  cholesterol.  Follow up with  [facility], [address] ICD Clinic December 27 at  9:45.  She will see Dr. Ladona Ridgel October 01, 2005, at 9:40.   DISCHARGE LABORATORY DATA:  Protime on December 14, 14.8, INR 1.1.  Complete  blood count with white blood cell count 6.4, hemoglobin 11.7, hematocrit  34.8, platelets 209.  Enzymes were cycled for the last 16 hours, December  12, CK 110, CK MB 3.1, December 13 at 9:15 a.m., CK 73, CK MB 2.9, December  13 at 3:35, CK 80, CK MB 2.7.      Maple Mirza, P.A.    ______________________________  Doylene Canning. Ladona Ridgel, M.D.    GM/MEDQ  D:  05/30/2005  T:  05/31/2005  Job:  [id number]   cc:   Eden Emms, M.D.

## 2010-11-02 NOTE — Assessment & Plan Note (Signed)
[facility]                         ELECTROPHYSIOLOGY OFFICE NOTE   KHAMIYA, VARIN                       MRN:          [id number]  DATE:05/19/2006                            DOB:          1945-07-03    Ms. Wakeland was seen today in the clinic on May 19, 2006 for  followup of her Medtronic model #7290 Onyx. Date of implant was May 28, 2005 for ischemic cardiomyopathy. She also has one of the 6949 alert  leads.   On interrogation of her device today, her battery voltage is 3.11 with a  charge time of 6.50 seconds. R waves measured 14 millivolts with a  ventricular pacing threshold of 1 volt at 0.1 msec and a ventricular  lead impedance of 521 ohms. Shock impedance was 17 ohms. There were 7  nonsustained episodes since last interrogation. Because of the type of  device this is, her lead alerts are not able to be reprogrammed. We did  explain and still demonstrate the tones that if the lead impedance gets  greater than 2000 it will alert and she is sending [facility] at 3, 6  and 9 months with a return office visit in 1 year.      Altha Harm, LPN  Electronically Signed      Doylene Canning. Ladona Ridgel, MD  Electronically Signed   PO/MedQ  DD: 05/19/2006  DT: 05/19/2006  Job #: [id number]

## 2010-11-02 NOTE — Assessment & Plan Note (Signed)
[facility]                         ELECTROPHYSIOLOGY OFFICE NOTE   [name], [name]                       MRN:          [id number]  DATE:08/04/2006                            DOB:          May 17, 1946    [name] was seen today in the clinic on August 04, 2006 for  follow up of her Medtronic model #7290 Onyx.  Date of implant was  May 28, 2005 for ischemic cardiomyopathy.  On interrogation of her  device today, her battery voltage is 3.10 with a charge time of 6.49  seconds.  R waves measured 12 mV with a ventricular pacing threshold of  1 volt at 0.1 ms and a ventricular lead impedance of 481 ohms.  Shock  impedance was 16.  There were 3 non-sustained episodes since the last  interrogation, which was December of 2007.  No changes were made in her  parameter.  She will continue on schedule with [facility].  She was seen  today at the request of her physician.  She had a fall where she lost  her footing and fractured her right arm, and her physician wanted to  double check that the defibrillator was intact.      Altha Harm, LPN  Electronically Signed      Doylene Canning. Ladona Ridgel, MD  Electronically Signed   PO/MedQ  DD: 08/04/2006  DT: 08/05/2006  Job #: [id number]

## 2010-11-08 ENCOUNTER — Encounter: Payer: Medicare Other | Admitting: *Deleted

## 2010-11-09 ENCOUNTER — Encounter: Payer: Self-pay | Admitting: *Deleted

## 2010-11-19 ENCOUNTER — Other Ambulatory Visit: Payer: Self-pay | Admitting: Internal Medicine

## 2010-11-19 ENCOUNTER — Ambulatory Visit (INDEPENDENT_AMBULATORY_CARE_PROVIDER_SITE_OTHER): Payer: Medicare Other | Admitting: *Deleted

## 2010-11-19 DIAGNOSIS — I428 Other cardiomyopathies: Secondary | ICD-10-CM

## 2010-11-21 NOTE — Progress Notes (Signed)
ICD REMOTE  

## 2010-12-07 ENCOUNTER — Encounter: Payer: Self-pay | Admitting: *Deleted

## 2010-12-17 ENCOUNTER — Encounter: Payer: Self-pay | Admitting: Internal Medicine

## 2011-02-20 ENCOUNTER — Other Ambulatory Visit: Payer: Self-pay | Admitting: Internal Medicine

## 2011-02-20 ENCOUNTER — Encounter: Payer: Self-pay | Admitting: Internal Medicine

## 2011-02-21 ENCOUNTER — Ambulatory Visit (INDEPENDENT_AMBULATORY_CARE_PROVIDER_SITE_OTHER): Payer: Medicare Other | Admitting: *Deleted

## 2011-02-21 DIAGNOSIS — Z95810 Presence of automatic (implantable) cardiac defibrillator: Secondary | ICD-10-CM

## 2011-02-21 DIAGNOSIS — I428 Other cardiomyopathies: Secondary | ICD-10-CM

## 2011-02-26 LAB — REMOTE ICD DEVICE
BATTERY VOLTAGE: 2.58 V
CHARGE TIME: 11.33 s
DEV-0020ICD: NEGATIVE
TZAT-0005FASTVT: 84 pct
TZAT-0005SLOWVT: 84 pct
TZAT-0005SLOWVT: 91 pct
TZAT-0011FASTVT: 10 ms
TZAT-0011SLOWVT: 10 ms
TZAT-0011SLOWVT: 10 ms
TZAT-0012FASTVT: 200 ms
TZAT-0012SLOWVT: 200 ms
TZAT-0012SLOWVT: 200 ms
TZAT-0018SLOWVT: NEGATIVE
TZAT-0018SLOWVT: NEGATIVE
TZAT-0019FASTVT: 8 V
TZON-0004SLOWVT: 16
TZON-0005SLOWVT: 12
TZST-0001FASTVT: 3
TZST-0001FASTVT: 5
TZST-0001FASTVT: 6
TZST-0001SLOWVT: 4
TZST-0001SLOWVT: 6
TZST-0003FASTVT: 20 J
TZST-0003FASTVT: 30 J
TZST-0003FASTVT: 30 J
TZST-0003SLOWVT: 10 J
TZST-0003SLOWVT: 30 J
TZST-0003SLOWVT: 30 J
VENTRICULAR PACING ICD: 0 pct

## 2011-03-04 NOTE — Progress Notes (Signed)
icd remote check  

## 2011-03-20 ENCOUNTER — Encounter: Payer: Self-pay | Admitting: *Deleted

## 2011-03-26 LAB — LIPID PANEL
LDL Cholesterol: 157 — ABNORMAL HIGH
VLDL: 15

## 2011-03-26 LAB — COMPREHENSIVE METABOLIC PANEL
ALT: 28
Albumin: 3.5
Calcium: 10
GFR calc Af Amer: >60
Glucose, Bld: 106 — ABNORMAL HIGH
Sodium: 139
Total Protein: 6.9

## 2011-03-26 LAB — HEMOGLOBIN A1C: Hgb A1c MFr Bld: 5.5

## 2011-03-26 LAB — CBC
MCHC: 33.9
Platelets: 278
RDW: 12.9

## 2011-03-26 LAB — CARDIAC PANEL(CRET KIN+CKTOT+MB+TROPI)
CK, MB: 4.7 — ABNORMAL HIGH
CK, MB: 5.9 — ABNORMAL HIGH
Relative Index: 1.6
Total CK: 331 — ABNORMAL HIGH
Troponin I: 0.07 — ABNORMAL HIGH
Troponin I: 0.08 — ABNORMAL HIGH

## 2011-03-26 LAB — PROTIME-INR
INR: 0.9
Prothrombin Time: 12.7

## 2011-03-28 ENCOUNTER — Ambulatory Visit (INDEPENDENT_AMBULATORY_CARE_PROVIDER_SITE_OTHER): Payer: Medicare Other | Admitting: *Deleted

## 2011-03-28 ENCOUNTER — Other Ambulatory Visit: Payer: Self-pay | Admitting: Internal Medicine

## 2011-03-28 ENCOUNTER — Encounter: Payer: Self-pay | Admitting: Internal Medicine

## 2011-03-28 DIAGNOSIS — I428 Other cardiomyopathies: Secondary | ICD-10-CM

## 2011-03-28 DIAGNOSIS — Z95810 Presence of automatic (implantable) cardiac defibrillator: Secondary | ICD-10-CM

## 2011-03-30 LAB — REMOTE ICD DEVICE
BATTERY VOLTAGE: 2.58 V
CHARGE TIME: 11.33 s
RV LEAD IMPEDENCE ICD: 481 Ohm
TOT-0006: 20120906000000
TZAT-0001FASTVT: 1
TZAT-0001SLOWVT: 2
TZAT-0004FASTVT: 8
TZAT-0004SLOWVT: 8
TZAT-0004SLOWVT: 8
TZAT-0012SLOWVT: 200 ms
TZAT-0012SLOWVT: 200 ms
TZAT-0019SLOWVT: 8 V
TZAT-0020FASTVT: 1.6 ms
TZAT-0020SLOWVT: 1.6 ms
TZAT-0020SLOWVT: 1.6 ms
TZST-0001FASTVT: 2
TZST-0001FASTVT: 4
TZST-0001FASTVT: 6
TZST-0001SLOWVT: 3
TZST-0001SLOWVT: 5
TZST-0003FASTVT: 20 J
TZST-0003FASTVT: 30 J
TZST-0003SLOWVT: 10 J
TZST-0003SLOWVT: 30 J

## 2011-04-01 NOTE — Progress Notes (Signed)
icd remote check  

## 2011-04-16 ENCOUNTER — Encounter: Payer: Self-pay | Admitting: *Deleted

## 2011-05-02 ENCOUNTER — Ambulatory Visit (INDEPENDENT_AMBULATORY_CARE_PROVIDER_SITE_OTHER): Payer: Medicare Other | Admitting: *Deleted

## 2011-05-02 DIAGNOSIS — I428 Other cardiomyopathies: Secondary | ICD-10-CM

## 2011-05-03 ENCOUNTER — Other Ambulatory Visit: Payer: Self-pay | Admitting: Internal Medicine

## 2011-05-03 LAB — REMOTE ICD DEVICE
CHARGE TIME: 11.33 s
DEV-0020ICD: NEGATIVE
HV IMPEDENCE: 17 Ohm
RV LEAD IMPEDENCE ICD: 444 Ohm
TOT-0006: 20121011000000
TZAT-0001FASTVT: 1
TZAT-0001SLOWVT: 1
TZAT-0001SLOWVT: 2
TZAT-0004FASTVT: 8
TZAT-0004SLOWVT: 8
TZAT-0012FASTVT: 200 ms
TZAT-0012SLOWVT: 200 ms
TZAT-0012SLOWVT: 200 ms
TZAT-0013FASTVT: 2
TZAT-0019FASTVT: 8 V
TZAT-0019SLOWVT: 8 V
TZAT-0019SLOWVT: 8 V
TZAT-0020FASTVT: 1.6 ms
TZAT-0020SLOWVT: 1.6 ms
TZAT-0020SLOWVT: 1.6 ms
TZON-0003FASTVT: 240 ms
TZON-0005SLOWVT: 12
TZST-0001FASTVT: 2
TZST-0001FASTVT: 4
TZST-0001FASTVT: 6
TZST-0001SLOWVT: 3
TZST-0001SLOWVT: 5
TZST-0003FASTVT: 30 J
TZST-0003FASTVT: 30 J
TZST-0003FASTVT: 30 J
TZST-0003SLOWVT: 10 J
TZST-0003SLOWVT: 20 J
TZST-0003SLOWVT: 30 J
VENTRICULAR PACING ICD: 0 pct

## 2011-05-08 NOTE — Progress Notes (Signed)
Remote icd check  

## 2011-05-22 ENCOUNTER — Encounter: Payer: Self-pay | Admitting: *Deleted

## 2011-06-06 ENCOUNTER — Encounter: Payer: Self-pay | Admitting: Internal Medicine

## 2011-06-06 ENCOUNTER — Encounter: Payer: Medicare Other | Admitting: *Deleted

## 2011-06-14 ENCOUNTER — Encounter: Payer: Self-pay | Admitting: *Deleted

## 2011-06-20 ENCOUNTER — Other Ambulatory Visit: Payer: Self-pay | Admitting: Internal Medicine

## 2011-06-20 ENCOUNTER — Encounter: Payer: Self-pay | Admitting: Internal Medicine

## 2011-06-20 ENCOUNTER — Ambulatory Visit (INDEPENDENT_AMBULATORY_CARE_PROVIDER_SITE_OTHER): Payer: Medicare Other | Admitting: *Deleted

## 2011-06-20 DIAGNOSIS — Z95810 Presence of automatic (implantable) cardiac defibrillator: Secondary | ICD-10-CM

## 2011-06-20 DIAGNOSIS — I428 Other cardiomyopathies: Secondary | ICD-10-CM

## 2011-06-22 LAB — REMOTE ICD DEVICE
BATTERY VOLTAGE: 2.58 V
CHARGE TIME: 11.34 s
CHARGE TIME: 11.34 s
DEV-0020ICD: NEGATIVE
HV IMPEDENCE: 16 Ohm
RV LEAD IMPEDENCE ICD: 481 Ohm
TZAT-0001FASTVT: 1
TZAT-0001SLOWVT: 1
TZAT-0001SLOWVT: 2
TZAT-0001SLOWVT: 2
TZAT-0004FASTVT: 8
TZAT-0004SLOWVT: 8
TZAT-0004SLOWVT: 8
TZAT-0005SLOWVT: 84 pct
TZAT-0005SLOWVT: 91 pct
TZAT-0005SLOWVT: 91 pct
TZAT-0011SLOWVT: 10 ms
TZAT-0012FASTVT: 200 ms
TZAT-0012SLOWVT: 200 ms
TZAT-0013FASTVT: 2
TZAT-0013SLOWVT: 2
TZAT-0018FASTVT: NEGATIVE
TZAT-0018SLOWVT: NEGATIVE
TZAT-0018SLOWVT: NEGATIVE
TZAT-0019FASTVT: 8 V
TZAT-0019FASTVT: 8 V
TZAT-0019SLOWVT: 8 V
TZAT-0020FASTVT: 1.6 ms
TZAT-0020SLOWVT: 1.6 ms
TZON-0003FASTVT: 240 ms
TZON-0003SLOWVT: 350 ms
TZON-0004SLOWVT: 16
TZON-0004SLOWVT: 16
TZON-0005SLOWVT: 12
TZST-0001FASTVT: 2
TZST-0001FASTVT: 4
TZST-0001FASTVT: 4
TZST-0001FASTVT: 6
TZST-0001SLOWVT: 3
TZST-0001SLOWVT: 3
TZST-0001SLOWVT: 5
TZST-0001SLOWVT: 6
TZST-0003FASTVT: 30 J
TZST-0003FASTVT: 30 J
TZST-0003FASTVT: 30 J
TZST-0003FASTVT: 30 J
TZST-0003SLOWVT: 10 J
TZST-0003SLOWVT: 30 J
TZST-0003SLOWVT: 30 J
VENTRICULAR PACING ICD: 0 pct

## 2011-06-27 NOTE — Progress Notes (Signed)
Remote icd check  

## 2011-07-03 ENCOUNTER — Encounter: Payer: Self-pay | Admitting: *Deleted

## 2011-07-31 ENCOUNTER — Ambulatory Visit (INDEPENDENT_AMBULATORY_CARE_PROVIDER_SITE_OTHER): Payer: Medicare Other | Admitting: Internal Medicine

## 2011-07-31 ENCOUNTER — Encounter: Payer: Self-pay | Admitting: Internal Medicine

## 2011-07-31 DIAGNOSIS — I428 Other cardiomyopathies: Secondary | ICD-10-CM

## 2011-07-31 DIAGNOSIS — I10 Essential (primary) hypertension: Secondary | ICD-10-CM

## 2011-07-31 DIAGNOSIS — I2589 Other forms of chronic ischemic heart disease: Secondary | ICD-10-CM

## 2011-07-31 DIAGNOSIS — Z95810 Presence of automatic (implantable) cardiac defibrillator: Secondary | ICD-10-CM

## 2011-07-31 LAB — ICD DEVICE OBSERVATION
BRDY-0002RV: 40 {beats}/min
CHARGE TIME: 11.34 s
DEV-0020ICD: NEGATIVE
RV LEAD AMPLITUDE: 9 mv
RV LEAD THRESHOLD: 1 V
TZAT-0012FASTVT: 200 ms
TZAT-0012SLOWVT: 200 ms
TZAT-0012SLOWVT: 200 ms
TZAT-0013FASTVT: 2
TZAT-0018SLOWVT: NEGATIVE
TZAT-0019FASTVT: 8 V
TZAT-0019SLOWVT: 8 V
TZAT-0019SLOWVT: 8 V
TZAT-0020FASTVT: 1.6 ms
TZAT-0020SLOWVT: 1.6 ms
TZAT-0020SLOWVT: 1.6 ms
TZON-0003FASTVT: 240 ms
TZON-0005SLOWVT: 12
TZST-0001FASTVT: 4
TZST-0001FASTVT: 6
TZST-0001SLOWVT: 5
TZST-0003FASTVT: 30 J
TZST-0003FASTVT: 30 J
TZST-0003SLOWVT: 10 J
TZST-0003SLOWVT: 20 J
TZST-0003SLOWVT: 30 J
VENTRICULAR PACING ICD: 0 pct
VF: 0

## 2011-07-31 NOTE — Assessment & Plan Note (Signed)
Her blood pressure today is well controlled. She will continue her current medical therapy and maintain a low-sodium diet. 

## 2011-07-31 NOTE — Assessment & Plan Note (Signed)
She denies anginal symptoms. She will continue her current medical therapy. 

## 2011-07-31 NOTE — Progress Notes (Signed)
HPI [name] returns today for followup. She is a very pleasant 66 year old woman with ischemic cardiomyopathy. She has class I congestive heart failure and an ejection fraction of 25%. She has nonsustained ventricular tachycardia. She has a remote stroke. She is approaching elective replacement on her ICD. She denies chest pain, shortness of breath, or peripheral edema. She has residual left-sided weakness. No Known Allergies   Current Outpatient Prescriptions  Medication Sig Dispense Refill  . benazepril (LOTENSIN) 5 MG tablet Take 5 mg by mouth Daily.      . Cholecalciferol (VITAMIN D) 2000 UNITS CAPS Take 1 capsule by mouth daily.        . clopidogrel (PLAVIX) 75 MG tablet Take 75 mg by mouth daily.        . digoxin (LANOXIN) 0.125 MG tablet Take 125 mcg by mouth daily.        . rosuvastatin (CRESTOR) 20 MG tablet Take 20 mg by mouth daily.      . sertraline (ZOLOFT) 50 MG tablet Take 50 mg by mouth Daily.      . traMADol (ULTRAM) 50 MG tablet Take 50 mg by mouth every 8 (eight) hours as needed.           Past Medical History  Diagnosis Date  . 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 Ischemic cardiomyopathy December 2006    with EF of 25%  . CHF (congestive heart failure) December 2006    Class I-II   . Hypertension   . Dyslipidemia   . CHF (congestive heart failure)   . CVA (cerebral vascular accident) 2002, 2008,     Dr. Heide Spark  . Uterine fibroid     ROS:   All systems reviewed and negative except as noted in the HPI.   Past Surgical History  Procedure Date  . Icd implantation December 2006  . Partial hysterectomy      Family History  Problem Relation Age of Onset  . Heart disease Mother 38    Cerebrovascular Accident  . Mental retardation Sister      History   Social History  . Marital Status: Legally Separated    Spouse Name: N/A    Number of Children: N/A  . Years of Education: N/A    Occupational History  . disabled    Social History Main Topics  . Smoking status: Former Smoker    Quit date: 06/18/2007  . Smokeless tobacco: Not on file   Comment: Patient smokes tobacco and cocaine  . Alcohol Use: Yes     Ocacasionally  . Drug Use: Yes  . Sexually Active: Not on file   Other Topics Concern  . Not on file   Social History Narrative  . No narrative on file     BP 122/68  Pulse 95  Ht 5\' 1"  (1.549 m)  Wt 51.256 kg (113 lb)  BMI 21.35 kg/m2  Physical Exam:  Well appearing middle-aged woman, NAD HEENT: Unremarkable Neck:  No JVD, no thyromegally Lungs:  Clear with no wheezes, rales, or rhonchi. Well-healed ICD incision. HEART:  Regular rate rhythm, no murmurs, no rubs, no clicks Abd:  soft, positive bowel sounds, no organomegally, no rebound, no guarding Ext:  2 plus pulses, no edema, no cyanosis, no clubbing Skin:  No rashes no nodules Neuro:  CN II through XII intact, motor grossly intact except for left arm and leg weakness.  DEVICE  Normal device function.  See PaceArt for details. Device is approaching elective replacement.  Assess/Plan:

## 2011-07-31 NOTE — Patient Instructions (Signed)
Your physician wants you to follow-up in: 12 months with Dr Taylor You will receive a reminder letter in the mail two months in advance. If you don't receive a letter, please call our office to schedule the follow-up appointment.   Remote monitoring is used to monitor your Pacemaker of ICD from home. This monitoring reduces the number of office visits required to check your device to one time per year. It allows us to keep an eye on the functioning of your device to ensure it is working properly. You are scheduled for a device check from home on 10/31/11. You may send your transmission at any time that day. If you have a wireless device, the transmission will be sent automatically. After your physician reviews your transmission, you will receive a postcard with your next transmission date.   

## 2011-07-31 NOTE — Assessment & Plan Note (Signed)
Her device is working normally. We'll plan to recheck in several months. She is approaching but not yet at elective replacement.

## 2011-08-29 ENCOUNTER — Encounter: Payer: Medicare Other | Admitting: *Deleted

## 2011-09-23 ENCOUNTER — Telehealth: Payer: Self-pay | Admitting: Internal Medicine

## 2011-09-23 NOTE — Telephone Encounter (Signed)
This was done and mailed in.  I have called the patient and made her aware

## 2011-09-23 NOTE — Telephone Encounter (Signed)
New Problem:     PAtient's sister is calling in because the last time she brought her sister in to the office for a visit she forgot to bring the paper work from the [employer] for her to be reimbursed for her miles driven.  She mailed the paperwork to our office on 08/22/11 and has not received it or heard word back about it.  Please call back.

## 2011-10-31 ENCOUNTER — Encounter: Payer: Medicare Other | Admitting: *Deleted

## 2011-11-01 ENCOUNTER — Ambulatory Visit (INDEPENDENT_AMBULATORY_CARE_PROVIDER_SITE_OTHER): Payer: Medicare Other | Admitting: *Deleted

## 2011-11-01 ENCOUNTER — Encounter: Payer: Self-pay | Admitting: Internal Medicine

## 2011-11-01 DIAGNOSIS — I428 Other cardiomyopathies: Secondary | ICD-10-CM

## 2011-11-01 DIAGNOSIS — Z95810 Presence of automatic (implantable) cardiac defibrillator: Secondary | ICD-10-CM

## 2011-11-04 LAB — REMOTE ICD DEVICE
CHARGE TIME: 11.34 s
DEV-0020ICD: NEGATIVE
TZAT-0011FASTVT: 10 ms
TZAT-0011SLOWVT: 10 ms
TZAT-0011SLOWVT: 10 ms
TZAT-0012FASTVT: 200 ms
TZAT-0012SLOWVT: 200 ms
TZAT-0012SLOWVT: 200 ms
TZAT-0013FASTVT: 2
TZAT-0013SLOWVT: 2
TZAT-0013SLOWVT: 2
TZAT-0018SLOWVT: NEGATIVE
TZAT-0018SLOWVT: NEGATIVE
TZAT-0019FASTVT: 8 V
TZAT-0019SLOWVT: 8 V
TZAT-0019SLOWVT: 8 V
TZAT-0020FASTVT: 1.6 ms
TZAT-0020SLOWVT: 1.6 ms
TZAT-0020SLOWVT: 1.6 ms
TZON-0003FASTVT: 240 ms
TZON-0003SLOWVT: 350 ms
TZON-0004SLOWVT: 16
TZON-0005SLOWVT: 12
TZST-0001FASTVT: 3
TZST-0001FASTVT: 4
TZST-0001SLOWVT: 4
TZST-0001SLOWVT: 5
TZST-0003FASTVT: 20 J
TZST-0003FASTVT: 30 J
TZST-0003FASTVT: 30 J
TZST-0003SLOWVT: 10 J
TZST-0003SLOWVT: 20 J
TZST-0003SLOWVT: 30 J

## 2011-11-18 ENCOUNTER — Encounter: Payer: Self-pay | Admitting: *Deleted

## 2011-12-05 ENCOUNTER — Ambulatory Visit (INDEPENDENT_AMBULATORY_CARE_PROVIDER_SITE_OTHER): Payer: Medicare Other | Admitting: *Deleted

## 2011-12-05 ENCOUNTER — Encounter: Payer: Self-pay | Admitting: Internal Medicine

## 2011-12-05 DIAGNOSIS — Z95810 Presence of automatic (implantable) cardiac defibrillator: Secondary | ICD-10-CM

## 2011-12-05 DIAGNOSIS — I428 Other cardiomyopathies: Secondary | ICD-10-CM

## 2011-12-16 LAB — REMOTE ICD DEVICE
CHARGE TIME: 11.45 s
DEV-0020ICD: NEGATIVE
TOT-0006: 20130517000000
TZAT-0004FASTVT: 8
TZAT-0004SLOWVT: 8
TZAT-0004SLOWVT: 8
TZAT-0005FASTVT: 84 pct
TZAT-0005SLOWVT: 91 pct
TZAT-0011FASTVT: 10 ms
TZAT-0012FASTVT: 200 ms
TZAT-0012SLOWVT: 200 ms
TZAT-0012SLOWVT: 200 ms
TZAT-0013FASTVT: 2
TZAT-0013SLOWVT: 2
TZAT-0013SLOWVT: 2
TZAT-0020SLOWVT: 1.6 ms
TZAT-0020SLOWVT: 1.6 ms
TZON-0003FASTVT: 240 ms
TZON-0003SLOWVT: 350 ms
TZST-0001FASTVT: 3
TZST-0001FASTVT: 5
TZST-0001SLOWVT: 4
TZST-0001SLOWVT: 6
TZST-0003FASTVT: 30 J
TZST-0003FASTVT: 30 J
TZST-0003FASTVT: 30 J
TZST-0003SLOWVT: 20 J
TZST-0003SLOWVT: 30 J

## 2012-01-06 ENCOUNTER — Encounter: Payer: Medicare Other | Admitting: *Deleted

## 2012-01-10 ENCOUNTER — Encounter: Payer: Self-pay | Admitting: *Deleted

## 2012-01-17 ENCOUNTER — Other Ambulatory Visit: Payer: Self-pay | Admitting: Internal Medicine

## 2012-01-17 ENCOUNTER — Encounter: Payer: Self-pay | Admitting: Internal Medicine

## 2012-01-17 ENCOUNTER — Ambulatory Visit (INDEPENDENT_AMBULATORY_CARE_PROVIDER_SITE_OTHER): Payer: Medicare Other | Admitting: *Deleted

## 2012-01-17 DIAGNOSIS — I428 Other cardiomyopathies: Secondary | ICD-10-CM

## 2012-01-21 ENCOUNTER — Encounter: Payer: Self-pay | Admitting: *Deleted

## 2012-01-21 LAB — REMOTE ICD DEVICE
DEV-0020ICD: NEGATIVE
HV IMPEDENCE: 16 Ohm
TOT-0006: 20130620000000
TZAT-0001FASTVT: 1
TZAT-0004FASTVT: 8
TZAT-0004SLOWVT: 8
TZAT-0004SLOWVT: 8
TZAT-0005FASTVT: 84 pct
TZAT-0005SLOWVT: 84 pct
TZAT-0005SLOWVT: 91 pct
TZAT-0011SLOWVT: 10 ms
TZAT-0012SLOWVT: 200 ms
TZAT-0012SLOWVT: 200 ms
TZAT-0013FASTVT: 2
TZAT-0013SLOWVT: 2
TZAT-0013SLOWVT: 2
TZAT-0020SLOWVT: 1.6 ms
TZON-0003FASTVT: 240 ms
TZON-0003SLOWVT: 350 ms
TZST-0001FASTVT: 2
TZST-0001FASTVT: 5
TZST-0001SLOWVT: 5
TZST-0001SLOWVT: 6
TZST-0003FASTVT: 20 J
TZST-0003FASTVT: 30 J
TZST-0003FASTVT: 30 J
TZST-0003FASTVT: 30 J
TZST-0003SLOWVT: 10 J
TZST-0003SLOWVT: 20 J

## 2012-01-30 ENCOUNTER — Encounter: Payer: Self-pay | Admitting: *Deleted

## 2012-04-20 ENCOUNTER — Ambulatory Visit (INDEPENDENT_AMBULATORY_CARE_PROVIDER_SITE_OTHER): Payer: Medicare Other | Admitting: *Deleted

## 2012-04-20 DIAGNOSIS — I428 Other cardiomyopathies: Secondary | ICD-10-CM

## 2012-04-20 DIAGNOSIS — Z95810 Presence of automatic (implantable) cardiac defibrillator: Secondary | ICD-10-CM

## 2012-04-21 ENCOUNTER — Encounter: Payer: Self-pay | Admitting: Internal Medicine

## 2012-04-27 LAB — REMOTE ICD DEVICE
BATTERY VOLTAGE: 2.58 V
HV IMPEDENCE: 17 Ohm
TOT-0006: 20130802000000
TZAT-0001FASTVT: 1
TZAT-0001SLOWVT: 1
TZAT-0001SLOWVT: 2
TZAT-0004FASTVT: 8
TZAT-0004SLOWVT: 8
TZAT-0004SLOWVT: 8
TZAT-0005FASTVT: 84 pct
TZAT-0005SLOWVT: 84 pct
TZAT-0005SLOWVT: 91 pct
TZAT-0011FASTVT: 10 ms
TZAT-0012SLOWVT: 200 ms
TZAT-0013SLOWVT: 2
TZAT-0013SLOWVT: 2
TZON-0003FASTVT: 240 ms
TZON-0003SLOWVT: 350 ms
TZST-0001FASTVT: 2
TZST-0001FASTVT: 5
TZST-0001SLOWVT: 3
TZST-0001SLOWVT: 6
TZST-0003FASTVT: 30 J
TZST-0003FASTVT: 30 J
TZST-0003SLOWVT: 20 J
TZST-0003SLOWVT: 30 J

## 2012-05-05 ENCOUNTER — Encounter: Payer: Self-pay | Admitting: *Deleted

## 2012-05-25 ENCOUNTER — Encounter: Payer: Medicare Other | Admitting: *Deleted

## 2012-06-04 ENCOUNTER — Encounter: Payer: Self-pay | Admitting: *Deleted

## 2012-06-12 ENCOUNTER — Encounter: Payer: Self-pay | Admitting: Internal Medicine

## 2012-06-12 ENCOUNTER — Ambulatory Visit (INDEPENDENT_AMBULATORY_CARE_PROVIDER_SITE_OTHER): Payer: Medicare Other | Admitting: *Deleted

## 2012-06-12 DIAGNOSIS — Z95810 Presence of automatic (implantable) cardiac defibrillator: Secondary | ICD-10-CM

## 2012-06-12 DIAGNOSIS — I428 Other cardiomyopathies: Secondary | ICD-10-CM

## 2012-06-15 LAB — REMOTE ICD DEVICE
BATTERY VOLTAGE: 2.58 V
HV IMPEDENCE: 17 Ohm
TOT-0006: 20131105000000
TZAT-0001FASTVT: 1
TZAT-0001SLOWVT: 1
TZAT-0001SLOWVT: 2
TZAT-0004FASTVT: 8
TZAT-0004SLOWVT: 8
TZAT-0004SLOWVT: 8
TZAT-0005FASTVT: 84 pct
TZAT-0005SLOWVT: 84 pct
TZAT-0005SLOWVT: 91 pct
TZAT-0013SLOWVT: 2
TZAT-0013SLOWVT: 2
TZAT-0018FASTVT: NEGATIVE
TZAT-0020FASTVT: 1.6 ms
TZAT-0020SLOWVT: 1.6 ms
TZAT-0020SLOWVT: 1.6 ms
TZON-0003SLOWVT: 350 ms
TZST-0001FASTVT: 2
TZST-0001FASTVT: 4
TZST-0001FASTVT: 5
TZST-0001SLOWVT: 3
TZST-0001SLOWVT: 5
TZST-0001SLOWVT: 6
TZST-0003FASTVT: 30 J
TZST-0003FASTVT: 30 J
TZST-0003SLOWVT: 20 J
TZST-0003SLOWVT: 30 J

## 2012-06-19 ENCOUNTER — Encounter: Payer: Self-pay | Admitting: *Deleted

## 2012-07-13 ENCOUNTER — Ambulatory Visit (INDEPENDENT_AMBULATORY_CARE_PROVIDER_SITE_OTHER): Payer: Medicare Other | Admitting: *Deleted

## 2012-07-13 DIAGNOSIS — I428 Other cardiomyopathies: Secondary | ICD-10-CM

## 2012-07-13 DIAGNOSIS — Z95810 Presence of automatic (implantable) cardiac defibrillator: Secondary | ICD-10-CM

## 2012-07-16 LAB — REMOTE ICD DEVICE
BATTERY VOLTAGE: 2.57 V
HV IMPEDENCE: 17 Ohm
RV LEAD IMPEDENCE ICD: 481 Ohm
TZAT-0001FASTVT: 1
TZAT-0001SLOWVT: 1
TZAT-0001SLOWVT: 2
TZAT-0004SLOWVT: 8
TZAT-0004SLOWVT: 8
TZAT-0013FASTVT: 2
TZAT-0013SLOWVT: 2
TZAT-0013SLOWVT: 2
TZAT-0018FASTVT: NEGATIVE
TZAT-0018SLOWVT: NEGATIVE
TZAT-0018SLOWVT: NEGATIVE
TZAT-0019FASTVT: 8 V
TZAT-0020FASTVT: 1.6 ms
TZAT-0020SLOWVT: 1.6 ms
TZAT-0020SLOWVT: 1.6 ms
TZON-0003FASTVT: 240 ms
TZON-0003SLOWVT: 350 ms
TZST-0001FASTVT: 2
TZST-0001FASTVT: 4
TZST-0001FASTVT: 5
TZST-0001FASTVT: 6
TZST-0001SLOWVT: 3
TZST-0001SLOWVT: 5
TZST-0001SLOWVT: 6
TZST-0003FASTVT: 30 J
TZST-0003FASTVT: 30 J
TZST-0003SLOWVT: 20 J
TZST-0003SLOWVT: 30 J
TZST-0003SLOWVT: 30 J

## 2012-08-06 ENCOUNTER — Encounter: Payer: Self-pay | Admitting: *Deleted

## 2012-08-13 ENCOUNTER — Encounter: Payer: Self-pay | Admitting: Internal Medicine

## 2012-08-18 ENCOUNTER — Encounter: Payer: Medicare Other | Admitting: Internal Medicine

## 2012-10-07 ENCOUNTER — Encounter: Payer: Medicare Other | Admitting: Internal Medicine

## 2012-10-09 ENCOUNTER — Encounter: Payer: Self-pay | Admitting: Internal Medicine

## 2012-10-09 ENCOUNTER — Ambulatory Visit (INDEPENDENT_AMBULATORY_CARE_PROVIDER_SITE_OTHER): Payer: Medicare Other | Admitting: Internal Medicine

## 2012-10-09 VITALS — BP 128/71 | HR 85 | Ht 60.0 in | Wt 106.2 lb

## 2012-10-09 DIAGNOSIS — Z95810 Presence of automatic (implantable) cardiac defibrillator: Secondary | ICD-10-CM

## 2012-10-09 DIAGNOSIS — I428 Other cardiomyopathies: Secondary | ICD-10-CM

## 2012-10-09 DIAGNOSIS — I2589 Other forms of chronic ischemic heart disease: Secondary | ICD-10-CM

## 2012-10-09 LAB — ICD DEVICE OBSERVATION
DEV-0020ICD: NEGATIVE
FVT: 0
PACEART VT: 0
RV LEAD AMPLITUDE: 6.4 mv
RV LEAD IMPEDENCE ICD: 521 Ohm
RV LEAD THRESHOLD: 1 V
TZAT-0004FASTVT: 8
TZAT-0011FASTVT: 10 ms
TZAT-0011SLOWVT: 10 ms
TZAT-0011SLOWVT: 10 ms
TZAT-0012FASTVT: 200 ms
TZAT-0012SLOWVT: 200 ms
TZAT-0012SLOWVT: 200 ms
TZAT-0013FASTVT: 2
TZAT-0013SLOWVT: 2
TZAT-0013SLOWVT: 2
TZAT-0018SLOWVT: NEGATIVE
TZAT-0019SLOWVT: 8 V
TZAT-0019SLOWVT: 8 V
TZON-0003FASTVT: 240 ms
TZON-0003SLOWVT: 350 ms
TZON-0004SLOWVT: 32
TZON-0005SLOWVT: 12
TZST-0001FASTVT: 3
TZST-0001FASTVT: 5
TZST-0001SLOWVT: 4
TZST-0003FASTVT: 20 J
TZST-0003FASTVT: 30 J
TZST-0003FASTVT: 30 J
TZST-0003SLOWVT: 10 J
TZST-0003SLOWVT: 30 J
VF: 0

## 2012-10-09 NOTE — Assessment & Plan Note (Signed)
She denies anginal symptoms. She will continue her current medical therapy. 

## 2012-10-09 NOTE — Patient Instructions (Addendum)
Your physician wants you to follow-up in: 12  Months with Dr Court Joy will receive a reminder letter in the mail two months in advance. If you don't receive a letter, please call our office to schedule the follow-up appointment.  Remote monitoring is used to monitor your Pacemaker or ICD from home. This monitoring reduces the number of office visits required to check your device to one time per year. It allows [place] to keep an eye on the functioning of your device to ensure it is working properly. You are scheduled for a device check from home on 11/10/12. You may send your transmission at any time that day. If you have a wireless device, the transmission will be sent automatically. After your physician reviews your transmission, you will receive a postcard with your next transmission date.

## 2012-10-09 NOTE — Assessment & Plan Note (Signed)
Her Medtronic single-chamber ICD is working normally. She is approaching elective replacement. We'll schedule ICD generator change out when she reaches elective replacement. She has a [phone] Medtronic lead and will need lead revision is well.

## 2012-10-09 NOTE — Progress Notes (Signed)
HPI [name] returns today for followup. She is a very pleasant 67 year old woman with a ischemic cardiomyopathy, chronic class 2 systolic heart failure, status post ICD implantation. The patient has still not reached elective replacement. She denies chest pain, shortness of breath, or peripheral edema. No syncope. No Known Allergies   Current Outpatient Prescriptions  Medication Sig Dispense Refill  . benazepril (LOTENSIN) 5 MG tablet Take 5 mg by mouth Daily.      . Cholecalciferol (VITAMIN D) 2000 UNITS CAPS Take 1 capsule by mouth daily.        . clopidogrel (PLAVIX) 75 MG tablet Take 75 mg by mouth daily.        . digoxin (LANOXIN) 0.125 MG tablet Take 125 mcg by mouth daily.        . rosuvastatin (CRESTOR) 20 MG tablet Take 20 mg by mouth daily.      . sertraline (ZOLOFT) 50 MG tablet Take 50 mg by mouth Daily.      . traMADol (ULTRAM) 50 MG tablet Take 50 mg by mouth every 8 (eight) hours as needed.         No current facility-administered medications for this visit.     Past Medical History  Diagnosis Date  . 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 Ischemic cardiomyopathy December 2006    with EF of 25%  . CHF (congestive heart failure) December 2006    Class I-II   . Hypertension   . Dyslipidemia   . CHF (congestive heart failure)   . CVA (cerebral vascular accident) 2002, 2008,     Dr. Heide Spark  . Uterine fibroid     ROS:   All systems reviewed and negative except as noted in the HPI.   Past Surgical History  Procedure Laterality Date  . Icd implantation  December 2006  . Partial hysterectomy       Family History  Problem Relation Age of Onset  . Heart disease Mother 48    Cerebrovascular Accident  . Mental retardation Sister      History   Social History  . Marital Status: Legally Separated    Spouse Name: N/A    Number of Children: N/A  . Years of Education: N/A   Occupational  History  . disabled    Social History Main Topics  . Smoking status: Former Smoker    Quit date: 06/18/2007  . Smokeless tobacco: Not on file     Comment: Patient smokes tobacco and cocaine  . Alcohol Use: Yes     Comment: Ocacasionally  . Drug Use: Yes  . Sexually Active: Not on file   Other Topics Concern  . Not on file   Social History Narrative  . No narrative on file     BP 128/71  Pulse 85  Ht 5' (1.524 m)  Wt 106 lb 3.2 oz (48.172 kg)  BMI 20.74 kg/m2  Physical Exam:  Well appearing middle-age woman,NAD HEENT: Unremarkable Neck:  7 cm JVD, no thyromegally Back:  No CVA tenderness Lungs:  Clear with no wheezes, rales, or rhonchi. HEART:  Regular rate rhythm, no murmurs, no rubs, no clicks Abd:  soft, positive bowel sounds, no organomegally, no rebound, no guarding Ext:  2 plus pulses, no edema, no cyanosis, no clubbing Skin:  No rashes no nodules Neuro:  CN II through XII intact, motor grossly intact  DEVICE  Normal device function.  See PaceArt for details.  Assess/Plan:

## 2012-11-10 ENCOUNTER — Encounter: Payer: Medicare Other | Admitting: *Deleted

## 2012-11-16 ENCOUNTER — Encounter: Payer: Self-pay | Admitting: *Deleted

## 2012-11-20 ENCOUNTER — Encounter: Payer: Self-pay | Admitting: Internal Medicine

## 2012-11-20 ENCOUNTER — Ambulatory Visit (INDEPENDENT_AMBULATORY_CARE_PROVIDER_SITE_OTHER): Payer: Medicare Other | Admitting: *Deleted

## 2012-11-20 DIAGNOSIS — Z95810 Presence of automatic (implantable) cardiac defibrillator: Secondary | ICD-10-CM

## 2012-11-20 DIAGNOSIS — I428 Other cardiomyopathies: Secondary | ICD-10-CM

## 2012-12-02 LAB — REMOTE ICD DEVICE
BATTERY VOLTAGE: 2.57 V
TOT-0006: 20140425000000
TZAT-0004FASTVT: 8
TZAT-0004SLOWVT: 8
TZAT-0004SLOWVT: 8
TZAT-0005SLOWVT: 84 pct
TZAT-0005SLOWVT: 91 pct
TZAT-0011FASTVT: 10 ms
TZAT-0011SLOWVT: 10 ms
TZAT-0011SLOWVT: 10 ms
TZAT-0012FASTVT: 200 ms
TZAT-0012SLOWVT: 200 ms
TZAT-0012SLOWVT: 200 ms
TZAT-0019SLOWVT: 8 V
TZAT-0020FASTVT: 1.6 ms
TZON-0005SLOWVT: 12
TZST-0001FASTVT: 3
TZST-0001FASTVT: 4
TZST-0001SLOWVT: 4
TZST-0003FASTVT: 20 J
TZST-0003FASTVT: 30 J
TZST-0003FASTVT: 30 J
TZST-0003SLOWVT: 10 J
TZST-0003SLOWVT: 30 J
TZST-0003SLOWVT: 30 J

## 2012-12-25 ENCOUNTER — Encounter: Payer: Self-pay | Admitting: *Deleted

## 2012-12-28 ENCOUNTER — Ambulatory Visit (INDEPENDENT_AMBULATORY_CARE_PROVIDER_SITE_OTHER): Payer: Medicare Other | Admitting: *Deleted

## 2012-12-28 DIAGNOSIS — I428 Other cardiomyopathies: Secondary | ICD-10-CM

## 2013-01-04 LAB — REMOTE ICD DEVICE
CHARGE TIME: 11.48 s
DEV-0020ICD: NEGATIVE
RV LEAD IMPEDENCE ICD: 481 Ohm
TZAT-0011FASTVT: 10 ms
TZAT-0011SLOWVT: 10 ms
TZAT-0011SLOWVT: 10 ms
TZAT-0012FASTVT: 200 ms
TZAT-0012SLOWVT: 200 ms
TZAT-0012SLOWVT: 200 ms
TZAT-0013FASTVT: 2
TZAT-0013SLOWVT: 2
TZAT-0018FASTVT: NEGATIVE
TZAT-0018SLOWVT: NEGATIVE
TZAT-0018SLOWVT: NEGATIVE
TZAT-0019FASTVT: 8 V
TZAT-0019SLOWVT: 8 V
TZAT-0019SLOWVT: 8 V
TZON-0003FASTVT: 240 ms
TZON-0004SLOWVT: 32
TZON-0005SLOWVT: 12
TZST-0001FASTVT: 3
TZST-0001FASTVT: 6
TZST-0001SLOWVT: 4
TZST-0003FASTVT: 20 J
TZST-0003FASTVT: 30 J
TZST-0003FASTVT: 30 J
TZST-0003SLOWVT: 30 J

## 2013-01-06 NOTE — Addendum Note (Signed)
Addended by: Gypsy Balsam K on: 01/06/2013 10:00 AM   Modules accepted: Level of Service

## 2013-02-01 ENCOUNTER — Encounter: Payer: Medicare Other | Admitting: *Deleted

## 2013-02-04 ENCOUNTER — Telehealth: Payer: Self-pay | Admitting: Internal Medicine

## 2013-02-04 NOTE — Telephone Encounter (Signed)
Spoke with CNA and let her know we are waiting for her remote to be sent to determine replacement

## 2013-02-04 NOTE — Telephone Encounter (Signed)
New Prob  Pt CNA wants to know when the pt will have her pacemaker replaced.  Per pt when she saw the doctor in April he said it would be some time in August.

## 2013-02-05 ENCOUNTER — Ambulatory Visit (INDEPENDENT_AMBULATORY_CARE_PROVIDER_SITE_OTHER): Payer: Medicare Other | Admitting: *Deleted

## 2013-02-05 ENCOUNTER — Encounter: Payer: Self-pay | Admitting: *Deleted

## 2013-02-05 DIAGNOSIS — I428 Other cardiomyopathies: Secondary | ICD-10-CM

## 2013-02-05 DIAGNOSIS — Z95810 Presence of automatic (implantable) cardiac defibrillator: Secondary | ICD-10-CM

## 2013-02-10 NOTE — Telephone Encounter (Signed)
Device still not at [facility], patient will receive letter to do another remote next month

## 2013-02-18 LAB — REMOTE ICD DEVICE
CHARGE TIME: 11.48 s
DEV-0020ICD: NEGATIVE
TOT-0006: 20140714000000
TZAT-0004FASTVT: 8
TZAT-0004SLOWVT: 8
TZAT-0005FASTVT: 84 pct
TZAT-0011FASTVT: 10 ms
TZAT-0011SLOWVT: 10 ms
TZAT-0011SLOWVT: 10 ms
TZAT-0012FASTVT: 200 ms
TZAT-0012SLOWVT: 200 ms
TZAT-0012SLOWVT: 200 ms
TZAT-0013FASTVT: 2
TZAT-0013SLOWVT: 2
TZAT-0013SLOWVT: 2
TZAT-0019SLOWVT: 8 V
TZAT-0019SLOWVT: 8 V
TZAT-0020FASTVT: 1.6 ms
TZAT-0020SLOWVT: 1.6 ms
TZAT-0020SLOWVT: 1.6 ms
TZON-0003FASTVT: 240 ms
TZON-0003SLOWVT: 350 ms
TZON-0005SLOWVT: 12
TZST-0001FASTVT: 3
TZST-0001FASTVT: 5
TZST-0001SLOWVT: 4
TZST-0001SLOWVT: 5
TZST-0001SLOWVT: 6
TZST-0003FASTVT: 30 J
TZST-0003FASTVT: 30 J
TZST-0003SLOWVT: 10 J
TZST-0003SLOWVT: 20 J
TZST-0003SLOWVT: 30 J

## 2013-03-01 ENCOUNTER — Encounter: Payer: Self-pay | Admitting: Internal Medicine

## 2013-03-10 ENCOUNTER — Encounter: Payer: Self-pay | Admitting: *Deleted

## 2013-03-15 ENCOUNTER — Ambulatory Visit (INDEPENDENT_AMBULATORY_CARE_PROVIDER_SITE_OTHER): Payer: Medicare Other | Admitting: *Deleted

## 2013-03-15 DIAGNOSIS — I428 Other cardiomyopathies: Secondary | ICD-10-CM

## 2013-03-15 LAB — REMOTE ICD DEVICE
DEV-0020ICD: NEGATIVE
RV LEAD IMPEDENCE ICD: 481 Ohm
TOT-0006: 20140829000000
TZAT-0004FASTVT: 8
TZAT-0004SLOWVT: 8
TZAT-0004SLOWVT: 8
TZAT-0005FASTVT: 84 pct
TZAT-0005SLOWVT: 91 pct
TZAT-0011SLOWVT: 10 ms
TZAT-0011SLOWVT: 10 ms
TZAT-0012FASTVT: 200 ms
TZAT-0012SLOWVT: 200 ms
TZAT-0012SLOWVT: 200 ms
TZAT-0020FASTVT: 1.6 ms
TZAT-0020SLOWVT: 1.6 ms
TZON-0003FASTVT: 240 ms
TZON-0003SLOWVT: 350 ms
TZST-0001FASTVT: 4
TZST-0001FASTVT: 5
TZST-0001SLOWVT: 4
TZST-0001SLOWVT: 5
TZST-0003FASTVT: 20 J
TZST-0003FASTVT: 30 J
TZST-0003FASTVT: 30 J
TZST-0003SLOWVT: 10 J
TZST-0003SLOWVT: 30 J
VENTRICULAR PACING ICD: 0 pct

## 2013-03-16 ENCOUNTER — Encounter: Payer: Self-pay | Admitting: *Deleted

## 2013-03-18 ENCOUNTER — Encounter: Payer: Self-pay | Admitting: Internal Medicine

## 2013-03-20 ENCOUNTER — Encounter: Payer: Self-pay | Admitting: Internal Medicine

## 2013-04-12 ENCOUNTER — Ambulatory Visit (INDEPENDENT_AMBULATORY_CARE_PROVIDER_SITE_OTHER): Payer: Medicare Other | Admitting: *Deleted

## 2013-04-12 DIAGNOSIS — Z45018 Encounter for adjustment and management of other part of cardiac pacemaker: Secondary | ICD-10-CM

## 2013-04-12 DIAGNOSIS — Z45010 Encounter for checking and testing of cardiac pacemaker pulse generator [battery]: Secondary | ICD-10-CM

## 2013-04-16 LAB — REMOTE ICD DEVICE
HV IMPEDENCE: 17 Ohm
RV LEAD IMPEDENCE ICD: 481 Ohm
TOT-0006: 20140929000000
TZAT-0001FASTVT: 1
TZAT-0001SLOWVT: 1
TZAT-0001SLOWVT: 2
TZAT-0004FASTVT: 8
TZAT-0005SLOWVT: 84 pct
TZAT-0005SLOWVT: 91 pct
TZAT-0011SLOWVT: 10 ms
TZAT-0011SLOWVT: 10 ms
TZAT-0013SLOWVT: 2
TZAT-0013SLOWVT: 2
TZAT-0018SLOWVT: NEGATIVE
TZAT-0018SLOWVT: NEGATIVE
TZAT-0019SLOWVT: 8 V
TZAT-0020FASTVT: 1.6 ms
TZON-0003SLOWVT: 350 ms
TZON-0004SLOWVT: 32
TZON-0005SLOWVT: 12
TZST-0001FASTVT: 2
TZST-0001FASTVT: 4
TZST-0001FASTVT: 6
TZST-0001SLOWVT: 4
TZST-0001SLOWVT: 6
TZST-0003FASTVT: 20 J
TZST-0003FASTVT: 30 J
TZST-0003SLOWVT: 20 J
TZST-0003SLOWVT: 30 J
VENTRICULAR PACING ICD: 0 pct

## 2013-04-23 ENCOUNTER — Encounter: Payer: Self-pay | Admitting: *Deleted

## 2013-04-23 NOTE — Addendum Note (Signed)
Addended by: Glenda Chroman on: 04/23/2013 04:53 PM   Modules accepted: Level of Service

## 2013-05-03 ENCOUNTER — Encounter: Payer: Self-pay | Admitting: Internal Medicine

## 2013-10-12 ENCOUNTER — Encounter: Payer: Self-pay | Admitting: Internal Medicine

## 2013-10-12 ENCOUNTER — Ambulatory Visit (INDEPENDENT_AMBULATORY_CARE_PROVIDER_SITE_OTHER): Payer: Medicare Other | Admitting: Internal Medicine

## 2013-10-12 VITALS — BP 118/72 | HR 69 | Ht 60.0 in | Wt 109.4 lb

## 2013-10-12 DIAGNOSIS — Z95810 Presence of automatic (implantable) cardiac defibrillator: Secondary | ICD-10-CM

## 2013-10-12 DIAGNOSIS — I428 Other cardiomyopathies: Secondary | ICD-10-CM

## 2013-10-12 DIAGNOSIS — I2589 Other forms of chronic ischemic heart disease: Secondary | ICD-10-CM

## 2013-10-12 LAB — MDC_IDC_ENUM_SESS_TYPE_INCLINIC
Brady Statistic RV Percent Paced: 0 %
Date Time Interrogation Session: 20150428155759
HIGH POWER IMPEDANCE MEASURED VALUE: 17 Ohm
HighPow Impedance: 35 Ohm
Lead Channel Impedance Value: 444 Ohm
Lead Channel Sensing Intrinsic Amplitude: 20 mV
Lead Channel Setting Sensing Sensitivity: 0.3 mV
MDC IDC MSMT BATTERY VOLTAGE: 2.54 V
MDC IDC MSMT LEADCHNL RV PACING THRESHOLD AMPLITUDE: 1 V
MDC IDC MSMT LEADCHNL RV PACING THRESHOLD PULSEWIDTH: 0.3 ms
MDC IDC SET LEADCHNL RV PACING AMPLITUDE: 2 V
MDC IDC SET LEADCHNL RV PACING PULSEWIDTH: 0.4 ms
MDC IDC SET ZONE DETECTION INTERVAL: 300 ms
MDC IDC SET ZONE DETECTION INTERVAL: 350 ms
Zone Setting Detection Interval: 240 ms

## 2013-10-12 NOTE — Patient Instructions (Signed)
Your physician has requested that you have an echocardiogram. Echocardiography is a painless test that uses sound waves to create images of your heart. It provides your doctor with information about the size and shape of your heart and how well your heart's chambers and valves are working. This procedure takes approximately one hour. There are no restrictions for this procedure.   Your physician recommends that you schedule a follow-up appointment will call with echo results and plan

## 2013-10-14 NOTE — Progress Notes (Signed)
HPI [name] returns today for followup. She is a very pleasant 68 year old woman with a ischemic cardiomyopathy, chronic class 2 systolic heart failure, status post ICD implantation. The patient has reached elective replacement. She denies chest pain, shortness of breath, or peripheral edema. No syncope. She has not had a 2D echo in several years but previously her CHF symptoms were well controlled and her EF had improved. She has not tolerated beta blockers due to fatigue. No Known Allergies   Current Outpatient Prescriptions  Medication Sig Dispense Refill  . benazepril (LOTENSIN) 5 MG tablet Take 5 mg by mouth Daily.      . Cholecalciferol (VITAMIN D) 2000 UNITS CAPS Take 1 capsule by mouth daily.        . clopidogrel (PLAVIX) 75 MG tablet Take 75 mg by mouth daily.        . digoxin (LANOXIN) 0.125 MG tablet Take 125 mcg by mouth daily.        . rosuvastatin (CRESTOR) 20 MG tablet Take 20 mg by mouth daily.      . sertraline (ZOLOFT) 50 MG tablet Take 50 mg by mouth Daily.      . traMADol (ULTRAM) 50 MG tablet Take 50 mg by mouth every 8 (eight) hours as needed.         No current facility-administered medications for this visit.     Past Medical History  Diagnosis Date  . 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 Ischemic cardiomyopathy December 2006    with EF of 25%  . CHF (congestive heart failure) December 2006    Class I-II   . Hypertension   . Dyslipidemia   . CHF (congestive heart failure)   . CVA (cerebral vascular accident) 2002, 2008,     Dr. Rob Hickman  . Uterine fibroid     ROS:   All systems reviewed and negative except as noted in the HPI.   Past Surgical History  Procedure Laterality Date  . Icd implantation  December 2006  . Partial hysterectomy       Family History  Problem Relation Age of Onset  . Heart disease Mother 21    Cerebrovascular Accident  . Mental retardation Sister       History   Social History  . Marital Status: Legally Separated    Spouse Name: N/A    Number of Children: N/A  . Years of Education: N/A   Occupational History  . disabled    Social History Main Topics  . Smoking status: Former Smoker    Quit date: 06/18/2007  . Smokeless tobacco: Not on file     Comment: Patient smokes tobacco and cocaine  . Alcohol Use: Yes     Comment: Ocacasionally  . Drug Use: Yes  . Sexual Activity: Not on file   Other Topics Concern  . Not on file   Social History Narrative  . No narrative on file     BP 118/72  Pulse 69  Ht 5' (1.524 m)  Wt 109 lb 6.4 oz (49.624 kg)  BMI 21.37 kg/m2  Physical Exam:  stable appearing middle-age woman,NAD HEENT: Unremarkable except for poor dentition Neck:  7 cm JVD, no thyromegally Back:  No CVA tenderness Lungs:  Clear with no wheezes, rales, or rhonchi. HEART:  Regular rate rhythm, no murmurs, no rubs, no clicks Abd:  soft, positive bowel sounds, no organomegally, no rebound, no guarding Ext:  2 plus  pulses, no edema, no cyanosis, no clubbing Skin:  No rashes no nodules Neuro:  CN II through XII intact, motor grossly intact  DEVICE  Normal device function but at ERI.   Assess/Plan:

## 2013-10-14 NOTE — Assessment & Plan Note (Signed)
Her Medtronic ICD has reached ERI. She has never had an ICD therapy. I have recommended that the patient undergo 2D echo. If her EF is below 35%, ICD will be re-implanted. If her EF has improved, then will withold ICD therapy. We would consider removing her generator but not the leads.

## 2013-10-20 ENCOUNTER — Encounter: Payer: Self-pay | Admitting: Internal Medicine

## 2013-10-28 ENCOUNTER — Ambulatory Visit (HOSPITAL_COMMUNITY): Payer: Medicare Other | Attending: Internal Medicine | Admitting: Radiology

## 2013-10-28 DIAGNOSIS — Z95810 Presence of automatic (implantable) cardiac defibrillator: Secondary | ICD-10-CM

## 2013-10-28 DIAGNOSIS — I2589 Other forms of chronic ischemic heart disease: Secondary | ICD-10-CM

## 2013-10-28 DIAGNOSIS — I428 Other cardiomyopathies: Secondary | ICD-10-CM | POA: Insufficient documentation

## 2013-10-28 DIAGNOSIS — I2109 ST elevation (STEMI) myocardial infarction involving other coronary artery of anterior wall: Secondary | ICD-10-CM

## 2013-10-28 NOTE — Progress Notes (Addendum)
Echocardiogram performed. Please call echo results to patient's sister [name] [phone]

## 2013-11-17 ENCOUNTER — Telehealth: Payer: Self-pay | Admitting: Internal Medicine

## 2013-11-17 ENCOUNTER — Other Ambulatory Visit: Payer: Self-pay | Admitting: *Deleted

## 2013-11-17 DIAGNOSIS — I255 Ischemic cardiomyopathy: Secondary | ICD-10-CM

## 2013-11-17 DIAGNOSIS — Z45018 Encounter for adjustment and management of other part of cardiac pacemaker: Secondary | ICD-10-CM

## 2013-11-17 NOTE — Telephone Encounter (Signed)
New message    Pt having a procedure on Friday.  Someone call the pt today but she has no idea what they were talking about.  Please call the sister and tell her.

## 2013-11-17 NOTE — Telephone Encounter (Signed)
Aware of where to go and I did not call her

## 2013-11-19 ENCOUNTER — Encounter (HOSPITAL_COMMUNITY)
Admission: RE | Disposition: A | Discharge status: Home / Self Care | Payer: Self-pay | Source: Ambulatory Visit | Attending: Internal Medicine

## 2013-11-19 ENCOUNTER — Ambulatory Visit (HOSPITAL_COMMUNITY)
Admission: RE | Admit: 2013-11-19 | Discharge: 2013-11-20 | Disposition: A | Discharge status: Home / Self Care | Payer: Medicare Other | Source: Ambulatory Visit | Attending: Internal Medicine | Admitting: Internal Medicine

## 2013-11-19 ENCOUNTER — Encounter (HOSPITAL_COMMUNITY): Payer: Self-pay | Admitting: General Practice

## 2013-11-19 DIAGNOSIS — I509 Heart failure, unspecified: Secondary | ICD-10-CM | POA: Insufficient documentation

## 2013-11-19 DIAGNOSIS — I10 Essential (primary) hypertension: Secondary | ICD-10-CM | POA: Diagnosis present

## 2013-11-19 DIAGNOSIS — Z87891 Personal history of nicotine dependence: Secondary | ICD-10-CM | POA: Insufficient documentation

## 2013-11-19 DIAGNOSIS — I252 Old myocardial infarction: Secondary | ICD-10-CM | POA: Insufficient documentation

## 2013-11-19 DIAGNOSIS — E785 Hyperlipidemia, unspecified: Secondary | ICD-10-CM | POA: Diagnosis present

## 2013-11-19 DIAGNOSIS — Z45018 Encounter for adjustment and management of other part of cardiac pacemaker: Secondary | ICD-10-CM

## 2013-11-19 DIAGNOSIS — T82198A Other mechanical complication of other cardiac electronic device, initial encounter: Secondary | ICD-10-CM | POA: Insufficient documentation

## 2013-11-19 DIAGNOSIS — I2589 Other forms of chronic ischemic heart disease: Secondary | ICD-10-CM | POA: Diagnosis present

## 2013-11-19 DIAGNOSIS — Z4502 Encounter for adjustment and management of automatic implantable cardiac defibrillator: Secondary | ICD-10-CM | POA: Insufficient documentation

## 2013-11-19 DIAGNOSIS — R031 Nonspecific low blood-pressure reading: Secondary | ICD-10-CM | POA: Insufficient documentation

## 2013-11-19 DIAGNOSIS — I2510 Atherosclerotic heart disease of native coronary artery without angina pectoris: Secondary | ICD-10-CM

## 2013-11-19 DIAGNOSIS — I255 Ischemic cardiomyopathy: Secondary | ICD-10-CM | POA: Diagnosis present

## 2013-11-19 DIAGNOSIS — I5022 Chronic systolic (congestive) heart failure: Secondary | ICD-10-CM

## 2013-11-19 DIAGNOSIS — Z8673 Personal history of transient ischemic attack (TIA), and cerebral infarction without residual deficits: Secondary | ICD-10-CM | POA: Insufficient documentation

## 2013-11-19 DIAGNOSIS — D259 Leiomyoma of uterus, unspecified: Secondary | ICD-10-CM | POA: Insufficient documentation

## 2013-11-19 HISTORY — DX: Major depressive disorder, single episode, unspecified: F32.9

## 2013-11-19 HISTORY — DX: ST elevation (STEMI) myocardial infarction involving other coronary artery of anterior wall: I21.09

## 2013-11-19 HISTORY — DX: Depression, unspecified: F32.A

## 2013-11-19 HISTORY — DX: Unspecified hearing loss, left ear: H91.92

## 2013-11-19 HISTORY — PX: IMPLANTABLE CARDIOVERTER DEFIBRILLATOR REVISION: SHX5470

## 2013-11-19 HISTORY — DX: Chronic systolic (congestive) heart failure: I50.22

## 2013-11-19 LAB — SURGICAL PCR SCREEN
MRSA, PCR: NEGATIVE
Staphylococcus aureus: NEGATIVE

## 2013-11-19 LAB — CBC
HCT: 36.7 % (ref 36.0–46.0)
Hemoglobin: 11.9 g/dL — ABNORMAL LOW (ref 12.0–15.0)
MCH: 30.4 pg (ref 26.0–34.0)
MCHC: 32.4 g/dL (ref 30.0–36.0)
MCV: 93.9 fL (ref 78.0–100.0)
PLATELETS: 183 10*3/uL (ref 150–400)
RBC: 3.91 MIL/uL (ref 3.87–5.11)
RDW: 12.7 % (ref 11.5–15.5)
WBC: 4.3 10*3/uL (ref 4.0–10.5)

## 2013-11-19 LAB — BASIC METABOLIC PANEL
BUN: 22 mg/dL (ref 6–23)
CALCIUM: 9.5 mg/dL (ref 8.4–10.5)
CHLORIDE: 103 meq/L (ref 96–112)
CO2: 25 meq/L (ref 19–32)
Creatinine, Ser: 1 mg/dL (ref 0.50–1.10)
GFR calc non Af Amer: 58 mL/min — ABNORMAL LOW (ref >90)
GFR, EST AFRICAN AMERICAN: 67 mL/min — AB (ref >90)
Glucose, Bld: 93 mg/dL (ref 70–99)
Potassium: 3.8 mEq/L (ref 3.7–5.3)
SODIUM: 140 meq/L (ref 137–147)

## 2013-11-19 LAB — PROTIME-INR
INR: 0.99 (ref 0.00–1.49)
PROTHROMBIN TIME: 12.9 s (ref 11.6–15.2)

## 2013-11-19 SURGERY — IMPLANTABLE CARDIOVERTER DEFIBRILLATOR REVISION
Anesthesia: LOCAL

## 2013-11-19 MED ORDER — LIDOCAINE HCL (PF) 1 % IJ SOLN
INTRAMUSCULAR | Status: AC
  Filled 2013-11-19: qty 30

## 2013-11-19 MED ORDER — SODIUM CHLORIDE 0.9 % IR SOLN
80.0000 mg | Status: DC
  Filled 2013-11-19 (×2): qty 2

## 2013-11-19 MED ORDER — ACETAMINOPHEN 325 MG PO TABS
325.0000 mg | ORAL_TABLET | ORAL | Status: DC | PRN
Start: 2013-11-19 — End: 2013-11-20
  Administered 2013-11-19 – 2013-11-20 (×3): 650 mg via ORAL
  Filled 2013-11-19 (×3): qty 2

## 2013-11-19 MED ORDER — SODIUM CHLORIDE 0.9 % IV SOLN: INTRAVENOUS | Status: DC

## 2013-11-19 MED ORDER — MIDAZOLAM HCL 5 MG/5ML IJ SOLN
INTRAMUSCULAR | Status: AC
  Filled 2013-11-19: qty 5

## 2013-11-19 MED ORDER — YOU HAVE A PACEMAKER BOOK
Freq: Once | Status: AC
  Administered 2013-11-19: 23:00:00
  Filled 2013-11-19: qty 1

## 2013-11-19 MED ORDER — ONDANSETRON HCL 4 MG/2ML IJ SOLN: 4.0000 mg | Freq: Four times a day (QID) | INTRAMUSCULAR | Status: DC | PRN

## 2013-11-19 MED ORDER — MUPIROCIN 2 % EX OINT: TOPICAL_OINTMENT | Freq: Two times a day (BID) | CUTANEOUS | Status: DC

## 2013-11-19 MED ORDER — CHLORHEXIDINE GLUCONATE 4 % EX LIQD
60.0000 mL | Freq: Once | CUTANEOUS | Status: DC
  Filled 2013-11-19: qty 60

## 2013-11-19 MED ORDER — CEFAZOLIN SODIUM 1-5 GM-% IV SOLN
1.0000 g | Freq: Four times a day (QID) | INTRAVENOUS | Status: AC
  Administered 2013-11-19 – 2013-11-20 (×3): 1 g via INTRAVENOUS
  Filled 2013-11-19 (×3): qty 50

## 2013-11-19 MED ORDER — HEPARIN (PORCINE) IN NACL 2-0.9 UNIT/ML-% IJ SOLN
INTRAMUSCULAR | Status: AC
  Filled 2013-11-19: qty 500

## 2013-11-19 MED ORDER — HEPARIN (PORCINE) IN NACL 2-0.9 UNIT/ML-% IJ SOLN
INTRAMUSCULAR | Status: DC
Start: 2013-11-19 — End: 2013-11-19
  Filled 2013-11-19: qty 500

## 2013-11-19 MED ORDER — TRAMADOL HCL 50 MG PO TABS
50.0000 mg | ORAL_TABLET | Freq: Four times a day (QID) | ORAL | Status: DC
  Administered 2013-11-19 (×2): 50 mg via ORAL
  Filled 2013-11-19 (×2): qty 1

## 2013-11-19 MED ORDER — CEFAZOLIN SODIUM-DEXTROSE 2-3 GM-% IV SOLR: 2.0000 g | INTRAVENOUS | Status: DC

## 2013-11-19 MED ORDER — FENTANYL CITRATE 0.05 MG/ML IJ SOLN
INTRAMUSCULAR | Status: AC
  Filled 2013-11-19: qty 2

## 2013-11-19 MED ORDER — MUPIROCIN 2 % EX OINT
TOPICAL_OINTMENT | CUTANEOUS | Status: AC
  Filled 2013-11-19: qty 22

## 2013-11-19 NOTE — CV Procedure (Signed)
Electrophysiology procedure note  Procedure: Insertion of a new active fixation defibrillation lead, removal of previously implanted ICD and insertion of a new ICD.  Indication: Ischemic cardiomyopathy, status post myocardial infarction in the remote past, persistent left ventricular dysfunction, ejection fraction 25% by recent echo, and the patient status post prior ICD implantation who had reached elective replacement.  Description of the procedure: After informed consent was obtained, the patient was taken to the diagnostic electrophysiology laboratory in the fasting state. After the usual preparation and draping, intravenous Versed and fentanyl were used for sedation. 30 cc of lidocaine was infiltrated into the left infraclavicular region. A 5 cm incision was carried out over this region and electrocautery was utilized to dissect down to the fascial plane. The patient had a indwelling 6949 active-fixation defibrillation lead, which was on recall. Because of the very high incidence of lead fracture following generator change out, we attempted to undergo insertion of a new defibrillation lead. The patient's left subclavian vein was subtotally occluded. Attempts to puncture the vein were very difficult. Finally the vein was punctured beyond its subtotal occlusion, and a Glidewire was utilized to be advanced into the central circulation. A 9 French sheath was advanced over the wire and the Medtronic model [id number] active fixation defibrillation lead, serial number [id number] was advanced into the right ventricle where mapping was carried out. About this time, the patient had received her second dose of Versed and fentanyl and was noted to have hypotension. Her blood pressure dropped from the 80s to the 70s all the way down to the 37T systolic. The cardiac silhouette was examined and found to be working satisfactorily. The patient was given a fluid bolus and over the next 10 minutes, her blood pressure improved.  The new defibrillator, which had been actively fixed and found to have R waves of 16 mv, an impedance of 731, and a threshold of 1 V at 0.5 ms, was secured to the fascial plane with silk suture. The sewing sleeve was also secured with silk suture. The pocket was irrigated. The old device was disconnected, and the new Medtronic single-chamber ICD, was inserted into the new defibrillation lead and placed back in the subcutaneous pocket. The pocket was irrigated with additional antibiotic irrigation. Caps were placed on the old defibrillator lead. The incision was closed with 2 layers of Vicryl suture. Benzoin and Steri-Strips for pain on the skin. Because of the patient's transient hypotension, a decision was made not to perform defibrillation threshold testing. The patient was returned to her room in satisfactory condition. Prior to leaving the room, a 2-D echo was obtained at the bedside which demonstrated no pericardial effusion, but with severe left ventricular dysfunction.  Complications: The procedure was complicated by hypotension, most likely related to being relatively dehydrated, having a cardiomyopathy, and being very sensitive to the combination of Versed and fentanyl.  Conclusion: Successful removal of a previous implanted Medtronic ICD which had reached elective replacement, insertion of a new Medtronic ICD lead and new Medtronic ICD in a patient with a long-standing ischemic cardiomyopathy, severe left ventricular dysfunction, ejection fraction 25%, and class II heart failure.  Cristopher Peru, M.D.

## 2013-11-19 NOTE — Progress Notes (Signed)
Notified MD on call that BP is low in the 70's new per MD procedure note however due to low EF and patient is not symptomatic we will continue to monitor and re evaluate fluids if needed per MD.

## 2013-11-19 NOTE — Discharge Summary (Signed)
ELECTROPHYSIOLOGY PROCEDURE DISCHARGE SUMMARY    Patient ID: [name],  MRN: [id number], DOB/AGE: 1945/07/01 68 y.o.  Admit date: 11/19/2013 Discharge date: 11/20/2013  Primary Care Physician: [name], [place] Electrophysiologist: [name]  Primary Discharge Diagnosis:  Ischemic cardiomyopathy with previously implanted ICD at [facility] and RV lead on manufacturer recall status post ICD gen change and RV lead revision this admission  Secondary Discharge Diagnosis:  1.  CAD - s/p anterior wall MI 2000 2.  CHF 3.  Hypertension 4.  Hyperlipidemia 5.  Prior CVA  Allergies  Allergen Reactions  . Beta Adrenergic Blockers     fatigue     Procedures This Admission:  1.  Implantation of a MDT single chamber ICD on 11-19-2013 by Dr [name].  The patient received a MDT ICD with model number 5027 right ventricular lead.  The patient's previously implanted 6949 lead was capped. There were no immediate post procedure complications. 2.  CXR on 11-20-2013 demonstrated no pneumothorax status post device implantation.   Brief HPI: [name] is a 68 y.o. female with a past medical history of ischemic cardiomyopathy.  She underwent ICD implantation in 2006 with 6949 right ventricular lead placement.  Her ICD has reached elective replacement indicator. Due to RV lead on manufacturer recall, recommendations were to replace RV lead at time of generator change. Risks, benefits, and alternatives were reviewed with the patient who wished to proceed.    Hospital Course:  The patient was admitted and underwent implantation of a MDT single chamber ICD with details as outlined above.   She was monitored on telemetry overnight which demonstrated sinus rhythm.  Left chest was without hematoma or ecchymosis.  The device was interrogated and found to be functioning normally.  CXR was obtained and demonstrated no pneumothorax status post device implantation.  Wound care, arm mobility, and restrictions were  reviewed with the patient.  Dr [name] examined the patient and considered them stable for discharge to home.   The patient's discharge medications include an ACE-I (Benazepril).  She is not able to tolerate beta blocker therapy due to fatigue.   Discharge Vitals: Blood pressure 120/67, pulse 77, temperature 99.1 F (37.3 C), temperature source Oral, resp. rate 16, weight 111 lb 15.9 oz (50.8 kg), SpO2 95.00%.   Physical Exam: Filed Vitals:   11/20/13 0001 11/20/13 0401 11/20/13 0600 11/20/13 0721  BP: 97/59 103/62 98/63 120/67  Pulse: 80 88  77  Temp: 98.2 F (36.8 C) 98.4 F (36.9 C)  99.1 F (37.3 C)  TempSrc: Oral Oral  Oral  Resp: 17 17  16   Weight: 111 lb 15.9 oz (50.8 kg)     SpO2: 95% 94%  95%    GEN- The patient is chronically ill appearing, alert and oriented x 3 today.   Head- normocephalic, atraumatic Eyes-  Sclera clear, conjunctiva pink Ears- hearing intact Oropharynx- clear Neck- supple  Lungs- Clear to ausculation bilaterally, normal work of breathing Heart- Regular rate and rhythm  GI- soft, NT, ND, + BS Extremities- no clubbing, cyanosis, or edema MS- chronic L sided weakness Skin- dressing with mild sanguinous drainage, no hematoma   Labs:   Lab Results  Component Value Date   WBC 4.3 11/19/2013   HGB 11.9* 11/19/2013   HCT 36.7 11/19/2013   MCV 93.9 11/19/2013   PLT 183 11/19/2013     Recent Labs Lab 11/19/13 1027  NA 140  K 3.8  CL 103  CO2 25  BUN 22  CREATININE 1.00  CALCIUM 9.5  GLUCOSE 93     Discharge Medications:    Medication List         benazepril 5 MG tablet  Commonly known as:  LOTENSIN  Take 5 mg by mouth Daily.     clopidogrel 75 MG tablet  Commonly known as:  PLAVIX  Take 75 mg by mouth daily.     digoxin 0.125 MG tablet  Commonly known as:  LANOXIN  Take 125 mcg by mouth daily.     ENSURE  Take 237 mLs by mouth 3 (three) times daily.     rosuvastatin 20 MG tablet  Commonly known as:  CRESTOR  Take 20 mg by  mouth daily.     sertraline 50 MG tablet  Commonly known as:  ZOLOFT  Take 50 mg by mouth Daily.     traMADol 50 MG tablet  Commonly known as:  ULTRAM  Take 50 mg by mouth every 8 (eight) hours as needed.     Vitamin D 2000 UNITS Caps  Take 1 capsule by mouth daily.        Disposition:   Follow-up Information   Follow up with [facility] Device 1 On 11/29/2013. (11:30am, For wound re-check)     home  Duration of Discharge Encounter: Greater than 30 minutes including physician time.  Signed,   Dr [name]

## 2013-11-19 NOTE — H&P (Signed)
HPI [name] returns today for followup. She is a very pleasant 68 year old woman with a ischemic cardiomyopathy, chronic class 2 systolic heart failure, status post ICD implantation. The patient has reached elective replacement. She denies chest pain, shortness of breath, or peripheral edema. No syncope. She had not had a 2D echo in several years but previously her CHF symptoms were well controlled and her EF had improved. She has not tolerated beta blockers due to fatigue. She has subsequently undergone repeat echo and her EF is 25%.  No Known Allergies      Current Outpatient Prescriptions   Medication  Sig  Dispense  Refill   .  benazepril (LOTENSIN) 5 MG tablet  Take 5 mg by mouth Daily.         .  Cholecalciferol (VITAMIN D) 2000 UNITS CAPS  Take 1 capsule by mouth daily.           .  clopidogrel (PLAVIX) 75 MG tablet  Take 75 mg by mouth daily.           .  digoxin (LANOXIN) 0.125 MG tablet  Take 125 mcg by mouth daily.           .  rosuvastatin (CRESTOR) 20 MG tablet  Take 20 mg by mouth daily.         .  sertraline (ZOLOFT) 50 MG tablet  Take 50 mg by mouth Daily.         .  traMADol (ULTRAM) 50 MG tablet  Take 50 mg by mouth every 8 (eight) hours as needed.               No current facility-administered medications for this visit.           Past Medical History   Diagnosis  Date   .  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  Ischemic cardiomyopathy  December 2006       with EF of 25%   .  CHF (congestive heart failure)  December 2006       Class I-II    .  Hypertension     .  Dyslipidemia     .  CHF (congestive heart failure)     .  CVA (cerebral vascular accident)  2002, 2008,        Dr. Rob Trujillo   .  Uterine fibroid          ROS:    All systems reviewed and negative except as noted in the HPI.      Past Surgical History   Procedure  Laterality  Date   .  Icd implantation    December 2006    .  Partial hysterectomy               Family History   Problem  Relation  Age of Onset   .  Heart disease  Mother  68       Cerebrovascular Accident   .  Mental retardation  Sister             History       Social History   .  Marital Status:  Legally Separated       Spouse Name:  N/A       Number of Children:  N/A   .  Years of Education:  N/A       Occupational History   .  disabled         Social History Main Topics   .  Smoking status:  Former Smoker       Quit date:  06/18/2007   .  Smokeless tobacco:  Not on file         Comment: Patient smokes tobacco and cocaine   .  Alcohol Use:  Yes         Comment: Ocacasionally   .  Drug Use:  Yes   .  Sexual Activity:  Not on file       Other Topics  Concern   .  Not on file       Social History Narrative   .  No narrative on file          BP 118/72  Pulse 69  Ht 5' (1.524 m)  Wt 109 lb 6.4 oz (49.624 kg)  BMI 21.37 kg/m2   Physical Exam:   stable appearing middle-age woman,NAD HEENT: Unremarkable except for poor dentition Neck:  7 cm JVD, no thyromegally Back:  No CVA tenderness Lungs:  Clear with no wheezes, rales, or rhonchi. HEART:  Regular rate rhythm, no murmurs, no rubs, no clicks Abd:  soft, positive bowel sounds, no organomegally, no rebound, no guarding Ext:  2 plus pulses, no edema, no cyanosis, no clubbing Skin:  No rashes no nodules Neuro:  CN II through XII intact, motor grossly intact   DEVICE   Normal device function but at ERI.    Assess/Plan:         ICD-Medtronic - Tiffany Lance, MD at 10/14/2013  9:58 PM    Status: Written Related Problem: ICD-Medtronic    Her Medtronic ICD has reached ERI. She has persistent LV dysfunction with an EF 25% by echo. She is intolerant of beta blocker due to fatigue and presents today to undergo ICD generator change out.  [name].D.

## 2013-11-20 ENCOUNTER — Encounter (HOSPITAL_COMMUNITY): Payer: Self-pay | Admitting: Nurse Practitioner

## 2013-11-20 ENCOUNTER — Ambulatory Visit (HOSPITAL_COMMUNITY): Payer: Medicare Other

## 2013-11-20 DIAGNOSIS — I2510 Atherosclerotic heart disease of native coronary artery without angina pectoris: Secondary | ICD-10-CM

## 2013-11-20 DIAGNOSIS — I5022 Chronic systolic (congestive) heart failure: Secondary | ICD-10-CM

## 2013-11-20 DIAGNOSIS — I2589 Other forms of chronic ischemic heart disease: Secondary | ICD-10-CM

## 2013-11-20 NOTE — Discharge Instructions (Signed)
° ° °  Supplemental Discharge Instructions for  Pacemaker/Defibrillator Patients  Activity No heavy lifting or vigorous activity with your left/right arm for 6 to 8 weeks.  Do not raise your left/right arm above your head for one week.  Gradually raise your affected arm as drawn below.             WOUND CARE   Keep the wound area clean and dry.  Do not get this area wet for one week. No showers for one week; you may shower in 1 week.   The tape/steri-strips on your wound will fall off; do not pull them off.  No bandage is needed on the site.  DO  NOT apply any creams, oils, or ointments to the wound area.   If you notice any drainage or discharge from the wound, any swelling or bruising at the site, or you develop a fever > 101? F after you are discharged home, call the office at once.  Special Instructions   You are still able to use cellular telephones; use the ear opposite the side where you have your pacemaker/defibrillator.  Avoid carrying your cellular phone near your device.   When traveling through airports, show security personnel your identification card to avoid being screened in the metal detectors.  Ask the security personnel to use the hand wand.   Avoid arc welding equipment, MRI testing (magnetic resonance imaging), TENS units (transcutaneous nerve stimulators).  Call the office for questions about other devices.   Avoid electrical appliances that are in poor condition or are not properly grounded.   Microwave ovens are safe to be near or to operate.  Additional information for defibrillator patients should your device go off:   If your device goes off ONCE and you feel fine afterward, notify the device clinic nurses.   If your device goes off ONCE and you do not feel well afterward, call 911.   If your device goes off TWICE, call 911.   If your device goes off THREE times in one day, call 911.  DO NOT DRIVE YOURSELF OR A FAMILY MEMBER WITH A DEFIBRILLATOR TO THE  HOSPITAL--CALL 911.

## 2013-11-22 ENCOUNTER — Telehealth: Payer: Self-pay | Admitting: Internal Medicine

## 2013-11-22 NOTE — Telephone Encounter (Signed)
**Note De-Identified Jariya Reichow Obfuscation** Crystal states that the pt is having some pain at her pacemaker incision site. Crystal is advised to give her Tylenol as needed for pain, she verbalized understanding.

## 2013-11-22 NOTE — Telephone Encounter (Signed)
New Message  Pt's CNA--Crystal called states the pt recently had a procedure and is in pain. Requests to have pain meds called in.. Please assist//SR

## 2013-11-22 NOTE — H&P (Signed)
  ICD Criteria  Current LVEF:25% ;Obtained < 1 month ago.  NYHA Functional Classification: Class II  Heart Failure History:  Yes, Duration of heart failure since onset is > 9 months  Non-Ischemic Dilated Cardiomyopathy History:  No.  Atrial Fibrillation/Atrial Flutter:  No.  Ventricular Tachycardia History:  No.  Cardiac Arrest History:  No  History of Syndromes with Risk of Sudden Death:  No.  Previous ICD:  Yes, ICD Type:  Single, Reason for ICD:  Primary prevention.  25%  Electrophysiology Study: No.  Prior MI: Yes, Most recent MI timeframe is > 40 days.  PPM: No.  OSA:  No  Patient Life Expectancy of >=1 year: Yes.  Anticoagulation Therapy:  Patient is NOT on anticoagulation therapy.   Beta Blocker Therapy:  No, Reason not on Beta Blocker therapy: fatigue and weakness  Ace Inhibitor/ARB Therapy:  Yes.

## 2013-11-29 ENCOUNTER — Ambulatory Visit (INDEPENDENT_AMBULATORY_CARE_PROVIDER_SITE_OTHER): Payer: Medicare Other | Admitting: *Deleted

## 2013-11-29 ENCOUNTER — Encounter: Payer: Self-pay | Admitting: Internal Medicine

## 2013-11-29 ENCOUNTER — Ambulatory Visit: Payer: Medicare Other

## 2013-11-29 DIAGNOSIS — I509 Heart failure, unspecified: Secondary | ICD-10-CM

## 2013-11-29 DIAGNOSIS — I428 Other cardiomyopathies: Secondary | ICD-10-CM

## 2013-11-29 DIAGNOSIS — I5022 Chronic systolic (congestive) heart failure: Secondary | ICD-10-CM

## 2013-11-29 LAB — MDC_IDC_ENUM_SESS_TYPE_INCLINIC
Battery Remaining Longevity: 138 mo
Battery Voltage: 3.16 V
Brady Statistic RV Percent Paced: 0 %
HighPow Impedance: 133 Ohm
HighPow Impedance: 44 Ohm
Lead Channel Impedance Value: 361 Ohm
Lead Channel Pacing Threshold Amplitude: 0.625 V
Lead Channel Pacing Threshold Pulse Width: 0.4 ms
Lead Channel Setting Pacing Amplitude: 3.5 V
Lead Channel Setting Pacing Pulse Width: 0.4 ms
Lead Channel Setting Sensing Sensitivity: 0.3 mV
MDC IDC MSMT LEADCHNL RV SENSING INTR AMPL: 10 mV
MDC IDC MSMT LEADCHNL RV SENSING INTR AMPL: 13.625 mV
MDC IDC SESS DTM: 20150615201329
Zone Setting Detection Interval: 300 ms
Zone Setting Detection Interval: 340 ms
Zone Setting Detection Interval: 360 ms

## 2013-11-29 NOTE — Progress Notes (Addendum)
Wound check appointment. Steri-strips removed. Wound without redness. Mild edema present. Incision edges approximated, wound well healed. Normal device function. Threshold, sensing, and impedances consistent with implant measurements. Device programmed at 3.5V for extra safety margin until 3 month visit. Histogram distribution appropriate for patient and level of activity. No ventricular arrhythmias noted. Patient educated about wound care, arm mobility, lifting restrictions, shock plan. ROV in 3 months with GT.

## 2014-02-22 ENCOUNTER — Ambulatory Visit (INDEPENDENT_AMBULATORY_CARE_PROVIDER_SITE_OTHER): Payer: Medicare Other | Admitting: Internal Medicine

## 2014-02-22 ENCOUNTER — Encounter: Payer: Self-pay | Admitting: Internal Medicine

## 2014-02-22 ENCOUNTER — Encounter: Payer: Medicare Other | Admitting: Internal Medicine

## 2014-02-22 VITALS — BP 118/72 | HR 81 | Ht 61.0 in | Wt 109.9 lb

## 2014-02-22 DIAGNOSIS — I509 Heart failure, unspecified: Secondary | ICD-10-CM

## 2014-02-22 DIAGNOSIS — Z95810 Presence of automatic (implantable) cardiac defibrillator: Secondary | ICD-10-CM

## 2014-02-22 DIAGNOSIS — I2589 Other forms of chronic ischemic heart disease: Secondary | ICD-10-CM

## 2014-02-22 DIAGNOSIS — I5022 Chronic systolic (congestive) heart failure: Secondary | ICD-10-CM

## 2014-02-22 NOTE — Progress Notes (Signed)
HPI [name] returns today for followup. She is a very pleasant 68 year old woman with a ischemic cardiomyopathy, chronic class 2 systolic heart failure, status post ICD implantation. The patient has undergone ICD generator change out with lead revision.  She denies chest pain, shortness of breath, or peripheral edema. No syncope. She is fairly sedentary. Allergies  Allergen Reactions  . Beta Adrenergic Blockers Other (See Comments)    fatigue     Current Outpatient Prescriptions  Medication Sig Dispense Refill  . benazepril (LOTENSIN) 5 MG tablet Take 5 mg by mouth Daily.      . Cholecalciferol (VITAMIN D) 2000 UNITS CAPS Take 1 capsule by mouth daily.        . clopidogrel (PLAVIX) 75 MG tablet Take 75 mg by mouth daily.       . digoxin (LANOXIN) 0.125 MG tablet Take 125 mcg by mouth daily.        Marland Kitchen ENSURE (ENSURE) Take 237 mLs by mouth daily.       . rosuvastatin (CRESTOR) 20 MG tablet Take 20 mg by mouth daily.      . sertraline (ZOLOFT) 50 MG tablet Take 50 mg by mouth Daily.      . traMADol (ULTRAM) 50 MG tablet Take 50 mg by mouth every 8 (eight) hours as needed for moderate pain.        No current facility-administered medications for this visit.     Past Medical History  Diagnosis Date  . Ischemic cardiomyopathy December 2006    a. 10/2013 Echo: EF 25-30%;  b. 11/2013 MDT single Lead ICD gen change and lead revision.  . Chronic systolic CHF (congestive heart failure), NYHA class 2     a. Dx 05/2005;  b. 10/2013 Echo: EF 25-30%, DK and scarring of mid-distal antsept, inf, infsept, & apical myocardium. Severe HK of anterior wall. Gr 1 DD.  Marland Kitchen Hypertension   . Dyslipidemia   . Uterine fibroid   . Depression   . 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 CVA (cerebral vascular accident) 2002, 2008,     Dr. Rob Hickman; "LUE very weak; left leg a little weak but can still walk"   . Deafness in left ear     ROS:   All  systems reviewed and negative except as noted in the HPI.   Past Surgical History  Procedure Laterality Date  . Cardiac defibrillator placement  05/2005; 11/19/2013    MDT ICD implanted 2006 by Dr Lovena Le with [phone] lead; gen change and RV lead revision 11/2013 by Dr Lovena Le  . Tonsillectomy    . Appendectomy    . Cholecystectomy    . Abdominal hysterectomy      "partial"     Family History  Problem Relation Age of Onset  . Heart disease Mother 55    Cerebrovascular Accident  . Mental retardation Sister      History   Social History  . Marital Status: Legally Separated    Spouse Name: N/A    Number of Children: N/A  . Years of Education: N/A   Occupational History  . disabled    Social History Main Topics  . Smoking status: Former Smoker -- 0.10 packs/day for 42 years    Types: Cigarettes    Quit date: 06/18/2007  . Smokeless tobacco: Never Used  . Alcohol Use: Yes     Comment: "stopped drinking alcohol in the 1990's"  . Drug Use: Yes  Special: Cocaine     Comment: "stopped using drugs "before 2009"  . Sexual Activity: No   Other Topics Concern  . Not on file   Social History Narrative  . No narrative on file     BP 118/72  Pulse 81  Ht 5\' 1"  (1.549 m)  Wt 109 lb 14.4 oz (49.85 kg)  BMI 20.78 kg/m2  Physical Exam:  stable appearing middle-age woman,NAD HEENT: Unremarkable Neck:  7 cm JVD, no thyromegally Back:  No CVA tenderness Lungs:  Clear with no wheezes, rales, or rhonchi. HEART:  Regular rate rhythm, no murmurs, no rubs, no clicks Abd:  soft, positive bowel sounds, no organomegally, no rebound, no guarding Ext:  2 plus pulses, no edema, no cyanosis, no clubbing Skin:  No rashes no nodules Neuro:  CN II through XII intact, motor grossly intact  DEVICE  Normal device function.  See PaceArt for details.   Assess/Plan:

## 2014-02-22 NOTE — Assessment & Plan Note (Signed)
She had one episode of fleeting chest pain. Non-exertional. Will follow.

## 2014-02-22 NOTE — Patient Instructions (Signed)
Remote monitoring is used to monitor your Pacemaker of ICD from home. This monitoring reduces the number of office visits required to check your device to one time per year. It allows [place] to keep an eye on the functioning of your device to ensure it is working properly. You are scheduled for a device check from home on May 26, 2014. You may send your transmission at any time that day. If you have a wireless device, the transmission will be sent automatically. After your physician reviews your transmission, you will receive a postcard with your next transmission date.  Your physician wants you to follow-up in: 1 year with Dr Knox Saliva will receive a reminder letter in the mail two months in advance. If you don't receive a letter, please call our office to schedule the follow-up appointment.

## 2014-02-22 NOTE — Assessment & Plan Note (Signed)
Her symptoms remain class 2. I have recommended she continue her current meds and maintain a low sodium diet. I encouraged the patient to increase her physical activity.

## 2014-02-22 NOTE — Assessment & Plan Note (Signed)
Her new Medtronic single chamber ICD and lead are working normally. Will follow and recheck in several months.

## 2014-02-23 LAB — MDC_IDC_ENUM_SESS_TYPE_INCLINIC
Battery Remaining Longevity: 137 mo
Battery Voltage: 3.14 V
Brady Statistic RV Percent Paced: 0 %
Date Time Interrogation Session: 20150908201304
HighPow Impedance: 133 Ohm
HighPow Impedance: 51 Ohm
Lead Channel Impedance Value: 399 Ohm
Lead Channel Pacing Threshold Amplitude: 0.75 V
Lead Channel Pacing Threshold Pulse Width: 0.4 ms
Lead Channel Sensing Intrinsic Amplitude: 14.875 mV
Lead Channel Sensing Intrinsic Amplitude: 15.625 mV
Lead Channel Setting Pacing Amplitude: 2.5 V
Lead Channel Setting Pacing Pulse Width: 0.4 ms
Lead Channel Setting Sensing Sensitivity: 0.3 mV
Zone Setting Detection Interval: 300 ms
Zone Setting Detection Interval: 340 ms
Zone Setting Detection Interval: 360 ms

## 2014-05-26 ENCOUNTER — Encounter (HOSPITAL_COMMUNITY): Payer: Self-pay | Admitting: Internal Medicine

## 2014-05-26 ENCOUNTER — Telehealth: Payer: Self-pay | Admitting: Cardiology

## 2014-05-26 ENCOUNTER — Ambulatory Visit (INDEPENDENT_AMBULATORY_CARE_PROVIDER_SITE_OTHER): Payer: Medicare Other | Admitting: *Deleted

## 2014-05-26 DIAGNOSIS — I5022 Chronic systolic (congestive) heart failure: Secondary | ICD-10-CM

## 2014-05-26 DIAGNOSIS — I429 Cardiomyopathy, unspecified: Secondary | ICD-10-CM

## 2014-05-26 LAB — MDC_IDC_ENUM_SESS_TYPE_REMOTE
Brady Statistic RV Percent Paced: 0.01 %
Date Time Interrogation Session: 20151210211745
HIGH POWER IMPEDANCE MEASURED VALUE: 52 Ohm
Lead Channel Impedance Value: 342 Ohm
Lead Channel Impedance Value: 399 Ohm
Lead Channel Pacing Threshold Amplitude: 0.75 V
Lead Channel Sensing Intrinsic Amplitude: 10.25 mV
Lead Channel Setting Pacing Amplitude: 2.5 V
Lead Channel Setting Pacing Pulse Width: 0.4 ms
Lead Channel Setting Sensing Sensitivity: 0.3 mV
MDC IDC MSMT BATTERY REMAINING LONGEVITY: 136 mo
MDC IDC MSMT BATTERY VOLTAGE: 3.1 V
MDC IDC MSMT LEADCHNL RV PACING THRESHOLD PULSEWIDTH: 0.4 ms
MDC IDC SET ZONE DETECTION INTERVAL: 340 ms
MDC IDC SET ZONE DETECTION INTERVAL: 360 ms
Zone Setting Detection Interval: 300 ms

## 2014-05-26 NOTE — Telephone Encounter (Signed)
Confirmed remote transmission w/ pt nurse.   

## 2014-05-26 NOTE — Progress Notes (Signed)
Remote ICD transmission.   

## 2014-06-14 ENCOUNTER — Encounter: Payer: Self-pay | Admitting: Internal Medicine

## 2014-08-29 ENCOUNTER — Telehealth: Payer: Self-pay | Admitting: Cardiology

## 2014-08-29 ENCOUNTER — Telehealth: Payer: Self-pay | Admitting: Internal Medicine

## 2014-08-29 ENCOUNTER — Ambulatory Visit (INDEPENDENT_AMBULATORY_CARE_PROVIDER_SITE_OTHER): Payer: Medicare Other | Admitting: *Deleted

## 2014-08-29 DIAGNOSIS — I429 Cardiomyopathy, unspecified: Secondary | ICD-10-CM | POA: Diagnosis not present

## 2014-08-29 DIAGNOSIS — I5022 Chronic systolic (congestive) heart failure: Secondary | ICD-10-CM | POA: Diagnosis not present

## 2014-08-29 NOTE — Telephone Encounter (Signed)
Spoke with pt and reminded pt of remote transmission that is due today. Pt verbalized understanding.   

## 2014-08-29 NOTE — Telephone Encounter (Signed)
Message left for [name] to send carelink transmission for [name].

## 2014-08-29 NOTE — Telephone Encounter (Signed)
LMOVM reminding pt to send remote transmission.   

## 2014-08-29 NOTE — Telephone Encounter (Signed)
New problem   Paulette need to know what exactly you need her to check on the pt's device. Please call.

## 2014-08-30 LAB — MDC_IDC_ENUM_SESS_TYPE_REMOTE
Brady Statistic RV Percent Paced: 0 %
Date Time Interrogation Session: 20160315183725
HighPow Impedance: 52 Ohm
Lead Channel Impedance Value: 304 Ohm
Lead Channel Pacing Threshold Amplitude: 0.625 V
Lead Channel Pacing Threshold Pulse Width: 0.4 ms
Lead Channel Sensing Intrinsic Amplitude: 12.25 mV
Lead Channel Sensing Intrinsic Amplitude: 12.25 mV
Lead Channel Setting Pacing Amplitude: 2.5 V
Lead Channel Setting Sensing Sensitivity: 0.3 mV
MDC IDC MSMT BATTERY REMAINING LONGEVITY: 135 mo
MDC IDC MSMT BATTERY VOLTAGE: 3.06 V
MDC IDC MSMT LEADCHNL RV IMPEDANCE VALUE: 361 Ohm
MDC IDC SET LEADCHNL RV PACING PULSEWIDTH: 0.4 ms
MDC IDC SET ZONE DETECTION INTERVAL: 300 ms
MDC IDC SET ZONE DETECTION INTERVAL: 340 ms
Zone Setting Detection Interval: 360 ms

## 2014-08-30 NOTE — Telephone Encounter (Signed)
Spoke w/Paulette in regards to sending transmission. Paulette to go to pt's house and give the clinic a call once there. It will be after 1400 today.

## 2014-08-31 NOTE — Progress Notes (Signed)
Remote ICD transmission.   

## 2014-09-08 ENCOUNTER — Encounter: Payer: Self-pay | Admitting: Cardiology

## 2014-09-16 ENCOUNTER — Encounter: Payer: Self-pay | Admitting: Internal Medicine

## 2014-11-29 ENCOUNTER — Ambulatory Visit (INDEPENDENT_AMBULATORY_CARE_PROVIDER_SITE_OTHER): Payer: Medicare Other | Admitting: *Deleted

## 2014-11-29 DIAGNOSIS — I429 Cardiomyopathy, unspecified: Secondary | ICD-10-CM

## 2014-11-29 DIAGNOSIS — I5022 Chronic systolic (congestive) heart failure: Secondary | ICD-10-CM

## 2014-11-29 NOTE — Progress Notes (Signed)
Remote ICD transmission.   

## 2014-12-03 LAB — CUP PACEART REMOTE DEVICE CHECK
Battery Remaining Longevity: 134 mo
Battery Voltage: 3.02 V
Brady Statistic RV Percent Paced: 0 %
Date Time Interrogation Session: 20160614131609
HIGH POWER IMPEDANCE MEASURED VALUE: 55 Ohm
Lead Channel Impedance Value: 285 Ohm
Lead Channel Impedance Value: 342 Ohm
Lead Channel Pacing Threshold Pulse Width: 0.4 ms
Lead Channel Setting Pacing Pulse Width: 0.4 ms
Lead Channel Setting Sensing Sensitivity: 0.3 mV
MDC IDC MSMT LEADCHNL RV PACING THRESHOLD AMPLITUDE: 0.625 V
MDC IDC MSMT LEADCHNL RV SENSING INTR AMPL: 11.75 mV
MDC IDC MSMT LEADCHNL RV SENSING INTR AMPL: 11.75 mV
MDC IDC SET LEADCHNL RV PACING AMPLITUDE: 2.5 V
MDC IDC SET ZONE DETECTION INTERVAL: 340 ms
Zone Setting Detection Interval: 300 ms
Zone Setting Detection Interval: 360 ms

## 2014-12-07 ENCOUNTER — Encounter: Payer: Self-pay | Admitting: Cardiology

## 2014-12-09 ENCOUNTER — Encounter: Payer: Self-pay | Admitting: Internal Medicine

## 2015-04-18 ENCOUNTER — Encounter: Payer: Self-pay | Admitting: Internal Medicine

## 2015-04-18 ENCOUNTER — Encounter: Payer: Self-pay | Admitting: Physician Assistant

## 2015-04-18 ENCOUNTER — Ambulatory Visit (INDEPENDENT_AMBULATORY_CARE_PROVIDER_SITE_OTHER): Payer: Medicare Other | Admitting: Physician Assistant

## 2015-04-18 VITALS — BP 130/82 | HR 84 | Ht 61.0 in | Wt 110.6 lb

## 2015-04-18 DIAGNOSIS — E785 Hyperlipidemia, unspecified: Secondary | ICD-10-CM

## 2015-04-18 DIAGNOSIS — Z95810 Presence of automatic (implantable) cardiac defibrillator: Secondary | ICD-10-CM

## 2015-04-18 DIAGNOSIS — I2583 Coronary atherosclerosis due to lipid rich plaque: Principal | ICD-10-CM

## 2015-04-18 DIAGNOSIS — I2510 Atherosclerotic heart disease of native coronary artery without angina pectoris: Secondary | ICD-10-CM

## 2015-04-18 DIAGNOSIS — I5022 Chronic systolic (congestive) heart failure: Secondary | ICD-10-CM | POA: Diagnosis not present

## 2015-04-18 DIAGNOSIS — I639 Cerebral infarction, unspecified: Secondary | ICD-10-CM | POA: Diagnosis not present

## 2015-04-18 DIAGNOSIS — I10 Essential (primary) hypertension: Secondary | ICD-10-CM

## 2015-04-18 NOTE — Progress Notes (Addendum)
Cardiology Office Note Date:  04/18/2015  Patient ID:  [name], DOB 02-Sep-1945, MRN [id number] PCP:  Lottie Dawson, MD  Cardiologist/Electrophysiologist: Dr. Lovena Le   Chief Complaint/HPI : [name] is a 69 y.o. female with history of ICM/ICD, chronic CHF, HTN, hyperlipidemia.  She was in the [facility] in [place] Oct 18 with what she and her sister state was a "mini-stroke" and told to f/u with her neurologist and cardiologist as soon as possible.   She saw Dr. Rob Hickman her neurologist already and has another visit scheduled for December, without any clear or specific findings.  They state she was told they did not know why she had a stroke.  She was feeling well and suddenly had slurred speech and "shaking all over".  No near syncope or syncope.  She was observed in the hospital 24 hours and instructed to see her neurologist and cardiologist a soon as possible, but state no one mentioned any abnormal cardiac findings to them, no irregularity of her heart beat or anything that they know of.  No changes were made to her medicines.  She reports that she returned to her baseline quickly, and remains at her baseline.  She denies any kind of CP, no palpitations, and no shocks from her device,  She has not had any near syncope or syncope.  She ambulates slowly, denies DOE with her routine daily activities, no rest SOB, no PND, orthopnea.  Past Medical History  Diagnosis Date  . Ischemic cardiomyopathy December 2006    a. 10/2013 Echo: EF 25-30%;  b. 11/2013 MDT single Lead ICD gen change and lead revision.  . Chronic systolic CHF (congestive heart failure), NYHA class 2 ([place])     a. Dx 05/2005;  b. 10/2013 Echo: EF 25-30%, DK and scarring of mid-distal antsept, inf, infsept, & apical myocardium. Severe HK of anterior wall. Gr 1 DD.  Marland Kitchen Hypertension   . Dyslipidemia   . Uterine fibroid   . Depression   . 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 CVA (cerebral vascular accident) [facility]) 2002, 2008, "mini-stroke"  Oct 2016    Dr. Rob Hickman; "LUE very weak; left leg a little weak but can still walk"   . Deafness in left ear     Past Surgical History  Procedure Laterality Date  . Cardiac defibrillator placement  05/2005; 11/19/2013    MDT ICD implanted 2006 by Dr Lovena Le with [phone] lead; gen change and RV lead revision 11/2013 by Dr Lovena Le  . Tonsillectomy    . Appendectomy    . Cholecystectomy    . Abdominal hysterectomy      "partial"  . Implantable cardioverter defibrillator revision N/A 11/19/2013    Procedure: IMPLANTABLE CARDIOVERTER DEFIBRILLATOR REVISION;  Surgeon: Evans Lance, MD;  Location: [facility] CATH LAB;  Service: Cardiovascular;  Laterality: N/A;    Current Outpatient Prescriptions  Medication Sig Dispense Refill  . benazepril (LOTENSIN) 5 MG tablet Take 5 mg by mouth Daily.    . Cholecalciferol (VITAMIN D) 2000 UNITS CAPS Take 1 capsule by mouth daily.      . clopidogrel (PLAVIX) 75 MG tablet Take 75 mg by mouth daily.     . digoxin (LANOXIN) 0.125 MG tablet Take 125 mcg by mouth daily.      Marland Kitchen ENSURE (ENSURE) Take 237 mLs by mouth daily.     . rosuvastatin (CRESTOR) 20 MG tablet Take 20 mg by mouth  daily.    . sertraline (ZOLOFT) 50 MG tablet Take 50 mg by mouth Daily.    . traMADol (ULTRAM) 50 MG tablet Take 50 mg by mouth every 8 (eight) hours as needed for moderate pain.      No current facility-administered medications for this visit.    Allergies:   Beta adrenergic blockers (reports of fatigue)  Social History:  The patient  reports that she quit smoking about 7 years ago. Her smoking use included Cigarettes. She has a 4.2 pack-year smoking history. She has never used smokeless tobacco. She quit drinking and using Cocaine back in 2009  Family History:  The patient's family history includes Heart disease (age of onset: 2) in her mother; Mental retardation in her sister.  ROS:   Please see the history of present illness.   All other systems are reviewed and otherwise negative.   PHYSICAL EXAM:  VS:  BP 130/82 mmHg  Pulse 84  Ht 5\' 1"  (1.549 m)  Wt 110 lb 9.6 oz (50.168 kg)  BMI 20.91 kg/m2 BMI: Body mass index is 20.91 kg/(m^2). Thin, looks older then her age, in no acute distress HEENT: normocephalic, atraumatic Neck: no JVD, carotid bruits or masses Cardiac:  normal S1, S2; RRR; no significant murmurs, no rubs, or gallops Lungs:  clear to auscultation bilaterally, no wheezing, rhonchi or rales Abd: soft, nontender,  + BS MS: no deformity or atrophy Ext: no edema Skin: warm and dry, no rash Neuro:  No gross deficits appreciated Psych: euthymic mood, full affect  ICD site is stable, no tethering or discomfort   EKG:  Done today shows SR, T changes inf/lat ICD check: see Pace art, one 9beat NSVT remotely in March  10/28/13: Echocardiogram Study Conclusions - Left ventricle: The cavity size was normal. Systolic function was severely reduced. The estimated ejection fraction was in the range of 25% to 30%. Dyskinesis and scarring of the mid-distalanteroseptal, inferior, inferoseptal, and apical myocardium. Severe hypokinesis of the entireanterior myocardium. Doppler parameters are consistent with abnormal left ventricular relaxation (grade 1 diastolic dysfunction). No evidence of thrombus. - Pericardium, extracardiac: A trivial pericardial effusion was identified.  Recent Labs: 11/19/13: BUN/Creat 22/1.00, K+ 3.8,  H/H 11.9/36.7, plts 183  Wt Readings from Last 3 Encounters:  04/18/15 110 lb 9.6 oz (50.168 kg)  02/22/14 109 lb 14.4 oz (49.85 kg)  11/20/13 111 lb 15.9 oz (50.8 kg)     Other studies reviewed: Additional studies/records reviewed today include: summarized above  Device Information: 2006 MDT ICD, on 11/19/13: generator change, MDT ICD and new RV defibrillation lead  ASSESSMENT AND PLAN:  1. ICM, chronic systolic CHF      [place] q64mo, We will have her enrolled into the ICM clinic with Myer Peer to aid in remote f/u compliance and care.     She appears euvolemic by exam today, lungs are clear, no edema, no symptoms     Reported allergy to BB with fatigue, on ACE      2. 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 Rob Hickman and following      She is in a wheelchair today because of the distance of the walk from parking, she states she is able to ambulate.     Historically on Coumadin, unclear when stopped, ?after 2008   4. HTN     Appears well controlled  5. Dyslipidemia     On statin  Reviewed case with Dr. Caryl Comes, we will schedule in [place], an echo doppler, an lexiscan myoview given EKG changes from previous, she is being sent for fasting labs, lipids, BMET, CBC, and a dig level.  She has an appointment to see Dr. Lovena Le in  14mo, she will keep that to discuss further plan of care, possibly a loop implant given a 3rd CVA/neuro event.   Disposition: F/u with Dr. Lovena Le at her scheduled visit, sooner if needed.  Current medicines are reviewed at length with the patient today.  The patient did not have any concerns regarding medicines.  Haywood Lasso, PA-C 04/18/2015 3:40 PM     [address] [place] [address] [phone] (office)  [phone] (fax)

## 2015-04-18 NOTE — Patient Instructions (Addendum)
Medication Instructions: Your physician recommends that you continue on your current medications as directed. Please refer to the Current Medication list given to you today.  If you need a refill on your cardiac medications before your next appointment, please call your pharmacy.  Labwork: RETURN FOR FASTING  BMET CBC LFT LIPIDS AND DIGOXIN LEVEL   *EARLY AM IF POSSIBLE BEFORE TAKING DOSE OF DIGOXIN *   Testing/Procedures:   Your physician has requested that you have an echocardiogram [place] [facility] . Echocardiography is a painless test that uses sound waves to create images of your heart. It provides your doctor with information about the size and shape of your heart and how well your heart's chambers and valves are working. This procedure takes approximately one hour. There are no restrictions for this procedure.  Your physician has requested that you have a lexiscan myoview.  [place] [facility] For further information please visit [email or web address]. Please follow instruction sheet, as given.     Follow-Up:  ALREADY SCHEDULED 05/25/15 WITH Dr Lovena Le    Any Other Special Instructions Will Be Listed Below (If Applicable).

## 2015-04-19 LAB — CUP PACEART INCLINIC DEVICE CHECK
Battery Voltage: 3.02 V
Date Time Interrogation Session: 20161101185530
HIGH POWER IMPEDANCE MEASURED VALUE: 46 Ohm
Lead Channel Impedance Value: 285 Ohm
Lead Channel Pacing Threshold Amplitude: 0.75 V
Lead Channel Sensing Intrinsic Amplitude: 16.125 mV
Lead Channel Setting Pacing Pulse Width: 0.4 ms
MDC IDC LEAD IMPLANT DT: 20150605
MDC IDC LEAD LOCATION: 753860
MDC IDC MSMT BATTERY REMAINING LONGEVITY: 131 mo
MDC IDC MSMT LEADCHNL RV IMPEDANCE VALUE: 361 Ohm
MDC IDC MSMT LEADCHNL RV PACING THRESHOLD PULSEWIDTH: 0.4 ms
MDC IDC SET LEADCHNL RV PACING AMPLITUDE: 2.5 V
MDC IDC SET LEADCHNL RV SENSING SENSITIVITY: 0.3 mV
MDC IDC STAT BRADY RV PERCENT PACED: 0 %

## 2015-04-25 ENCOUNTER — Other Ambulatory Visit: Payer: Self-pay | Admitting: Cardiovascular Disease

## 2015-04-25 ENCOUNTER — Other Ambulatory Visit (HOSPITAL_COMMUNITY): Payer: Medicare Other

## 2015-04-25 ENCOUNTER — Other Ambulatory Visit: Payer: Self-pay | Admitting: *Deleted

## 2015-04-25 DIAGNOSIS — I2510 Atherosclerotic heart disease of native coronary artery without angina pectoris: Secondary | ICD-10-CM

## 2015-04-25 DIAGNOSIS — I2583 Coronary atherosclerosis due to lipid rich plaque: Principal | ICD-10-CM

## 2015-04-25 NOTE — Progress Notes (Signed)
Patient referred to [facility] clinic by Tommye Standard, PA-C.  Met patient in the office and provided ICM introduction and she agreed to monthly follow up.  Patient is HOH.  Letter sent providing patient with scheduled home transmission date  06/26/2015.  Patient has appointment with Dr Lovena Le on 05/25/2015.

## 2015-04-26 ENCOUNTER — Encounter (HOSPITAL_COMMUNITY): Payer: Self-pay

## 2015-04-26 ENCOUNTER — Inpatient Hospital Stay (HOSPITAL_COMMUNITY): Admission: RE | Admit: 2015-04-26 | Payer: Medicare Other | Source: Ambulatory Visit

## 2015-04-26 ENCOUNTER — Other Ambulatory Visit (HOSPITAL_COMMUNITY)
Admission: RE | Admit: 2015-04-26 | Discharge: 2015-04-26 | Disposition: A | Discharge status: Home / Self Care | Payer: Medicare Other | Source: Ambulatory Visit | Attending: Internal Medicine | Admitting: Internal Medicine

## 2015-04-26 ENCOUNTER — Encounter (HOSPITAL_COMMUNITY)
Admission: RE | Admit: 2015-04-26 | Discharge: 2015-04-26 | Disposition: A | Discharge status: Home / Self Care | Payer: Medicare Other | Source: Ambulatory Visit | Attending: Physician Assistant | Admitting: Physician Assistant

## 2015-04-26 ENCOUNTER — Ambulatory Visit (HOSPITAL_COMMUNITY)
Admission: RE | Admit: 2015-04-26 | Discharge: 2015-04-26 | Disposition: A | Discharge status: Home / Self Care | Payer: Medicare Other | Source: Ambulatory Visit | Attending: Physician Assistant | Admitting: Physician Assistant

## 2015-04-26 ENCOUNTER — Encounter (HOSPITAL_BASED_OUTPATIENT_CLINIC_OR_DEPARTMENT_OTHER)
Admission: RE | Admit: 2015-04-26 | Discharge: 2015-04-26 | Disposition: A | Discharge status: Home / Self Care | Payer: Medicare Other | Source: Ambulatory Visit | Attending: Physician Assistant | Admitting: Physician Assistant

## 2015-04-26 DIAGNOSIS — R931 Abnormal findings on diagnostic imaging of heart and coronary circulation: Secondary | ICD-10-CM | POA: Insufficient documentation

## 2015-04-26 DIAGNOSIS — I5022 Chronic systolic (congestive) heart failure: Secondary | ICD-10-CM | POA: Diagnosis not present

## 2015-04-26 DIAGNOSIS — E119 Type 2 diabetes mellitus without complications: Secondary | ICD-10-CM | POA: Insufficient documentation

## 2015-04-26 DIAGNOSIS — I2583 Coronary atherosclerosis due to lipid rich plaque: Secondary | ICD-10-CM

## 2015-04-26 DIAGNOSIS — I10 Essential (primary) hypertension: Secondary | ICD-10-CM | POA: Diagnosis not present

## 2015-04-26 DIAGNOSIS — I509 Heart failure, unspecified: Secondary | ICD-10-CM | POA: Insufficient documentation

## 2015-04-26 DIAGNOSIS — I2510 Atherosclerotic heart disease of native coronary artery without angina pectoris: Secondary | ICD-10-CM

## 2015-04-26 LAB — CBC
HCT: 33.8 % — ABNORMAL LOW (ref 36.0–46.0)
HEMOGLOBIN: 10.9 g/dL — AB (ref 12.0–15.0)
MCH: 30.4 pg (ref 26.0–34.0)
MCHC: 32.2 g/dL (ref 30.0–36.0)
MCV: 94.4 fL (ref 78.0–100.0)
PLATELETS: 250 10*3/uL (ref 150–400)
RBC: 3.58 MIL/uL — ABNORMAL LOW (ref 3.87–5.11)
RDW: 12.9 % (ref 11.5–15.5)
WBC: 4.1 10*3/uL (ref 4.0–10.5)

## 2015-04-26 LAB — NM MYOCAR MULTI W/SPECT W/WALL MOTION / EF
CHL CUP NUCLEAR SDS: 0
CHL CUP RESTING HR STRESS: 78 {beats}/min
CSEPPHR: 116 {beats}/min
LHR: 0.23
LVDIAVOL: 116 mL
LVSYSVOL: 85 mL
SRS: 27
SSS: 27
TID: 1.11

## 2015-04-26 LAB — BASIC METABOLIC PANEL
ANION GAP: 7 (ref 5–15)
BUN: 20 mg/dL (ref 6–20)
CALCIUM: 9.2 mg/dL (ref 8.9–10.3)
CO2: 25 mmol/L (ref 22–32)
CREATININE: 0.95 mg/dL (ref 0.44–1.00)
Chloride: 106 mmol/L (ref 101–111)
GFR calc Af Amer: >60 mL/min (ref >60)
GLUCOSE: 91 mg/dL (ref 65–99)
Potassium: 4 mmol/L (ref 3.5–5.1)
Sodium: 138 mmol/L (ref 135–145)

## 2015-04-26 LAB — LIPID PANEL
CHOL/HDL RATIO: 2.8 ratio
CHOLESTEROL: 187 mg/dL (ref 0–200)
HDL: 66 mg/dL (ref >40)
LDL Cholesterol: 110 mg/dL — ABNORMAL HIGH (ref 0–99)
TRIGLYCERIDES: 55 mg/dL (ref <150)
VLDL: 11 mg/dL (ref 0–40)

## 2015-04-26 LAB — HEPATIC FUNCTION PANEL
ALT: 36 U/L (ref 14–54)
AST: 36 U/L (ref 15–41)
Albumin: 3.8 g/dL (ref 3.5–5.0)
Alkaline Phosphatase: 71 U/L (ref 38–126)
BILIRUBIN DIRECT: 0.1 mg/dL (ref 0.1–0.5)
BILIRUBIN INDIRECT: 0.7 mg/dL (ref 0.3–0.9)
TOTAL PROTEIN: 6.6 g/dL (ref 6.5–8.1)
Total Bilirubin: 0.8 mg/dL (ref 0.3–1.2)

## 2015-04-26 LAB — DIGOXIN LEVEL: DIGOXIN LVL: 0.7 ng/mL — AB (ref 0.8–2.0)

## 2015-04-26 MED ORDER — REGADENOSON 0.4 MG/5ML IV SOLN
INTRAVENOUS | Status: AC
  Administered 2015-04-26: 0.4 mg via INTRAVENOUS
  Filled 2015-04-26: qty 5

## 2015-04-26 MED ORDER — TECHNETIUM TC 99M SESTAMIBI - CARDIOLITE
30.0000 | Freq: Once | INTRAVENOUS | Status: AC | PRN
  Administered 2015-04-26: 11:00:00 30 via INTRAVENOUS

## 2015-04-26 MED ORDER — SODIUM CHLORIDE 0.9 % IJ SOLN
INTRAMUSCULAR | Status: AC
  Administered 2015-04-26: 10 mL via INTRAVENOUS
  Filled 2015-04-26: qty 3

## 2015-04-26 MED ORDER — TECHNETIUM TC 99M SESTAMIBI GENERIC - CARDIOLITE
10.0000 | Freq: Once | INTRAVENOUS | Status: AC | PRN
  Administered 2015-04-26: 10 via INTRAVENOUS

## 2015-04-27 ENCOUNTER — Encounter: Payer: Medicare Other | Admitting: Internal Medicine

## 2015-05-02 ENCOUNTER — Telehealth: Payer: Self-pay | Admitting: *Deleted

## 2015-05-02 NOTE — Telephone Encounter (Signed)
-----   Message from [facility], [place] sent at 05/02/2015  1:29 PM EST ----- Please let patient know that echo result is stable from her last.  Make sure she has the appointment with Dr. Lovena Le still coming up in the next month or so previously scheduled.

## 2015-05-25 ENCOUNTER — Ambulatory Visit (INDEPENDENT_AMBULATORY_CARE_PROVIDER_SITE_OTHER): Payer: Medicare Other | Admitting: Internal Medicine

## 2015-05-25 ENCOUNTER — Encounter: Payer: Self-pay | Admitting: Internal Medicine

## 2015-05-25 VITALS — BP 108/68 | HR 84 | Ht 60.0 in | Wt 108.0 lb

## 2015-05-25 DIAGNOSIS — I5022 Chronic systolic (congestive) heart failure: Secondary | ICD-10-CM | POA: Diagnosis not present

## 2015-05-25 DIAGNOSIS — Z95810 Presence of automatic (implantable) cardiac defibrillator: Secondary | ICD-10-CM

## 2015-05-25 DIAGNOSIS — I2510 Atherosclerotic heart disease of native coronary artery without angina pectoris: Secondary | ICD-10-CM | POA: Diagnosis not present

## 2015-05-25 LAB — CUP PACEART INCLINIC DEVICE CHECK
Battery Remaining Longevity: 130 mo
Battery Voltage: 3.02 V
Date Time Interrogation Session: 20161208160930
HighPow Impedance: 47 Ohm
Implantable Lead Implant Date: 20150605
Implantable Lead Location: 753860
Lead Channel Pacing Threshold Pulse Width: 0.4 ms
Lead Channel Setting Pacing Amplitude: 2.5 V
Lead Channel Setting Pacing Pulse Width: 0.4 ms
Lead Channel Setting Sensing Sensitivity: 0.3 mV
MDC IDC MSMT LEADCHNL RV IMPEDANCE VALUE: 304 Ohm
MDC IDC MSMT LEADCHNL RV IMPEDANCE VALUE: 361 Ohm
MDC IDC MSMT LEADCHNL RV PACING THRESHOLD AMPLITUDE: 0.625 V
MDC IDC MSMT LEADCHNL RV SENSING INTR AMPL: 11.625 mV
MDC IDC MSMT LEADCHNL RV SENSING INTR AMPL: 14.5 mV
MDC IDC STAT BRADY RV PERCENT PACED: 0 %

## 2015-05-25 NOTE — Progress Notes (Signed)
HPI [name] returns today for followup. She is a very pleasant 69 year old woman with a ischemic cardiomyopathy, chronic class 2 systolic heart failure, status post ICD implantation. The patient has undergone ICD generator change out with lead revision.  She denies chest pain, shortness of breath, or peripheral edema. No syncope. She is fairly sedentary. No ICD shock Allergies  Allergen Reactions  . Beta Adrenergic Blockers Other (See Comments)    fatigue     Current Outpatient Prescriptions  Medication Sig Dispense Refill  . baclofen (LIORESAL) 10 MG tablet     . benazepril (LOTENSIN) 5 MG tablet Take 5 mg by mouth Daily.    . Cholecalciferol (VITAMIN D) 2000 UNITS CAPS Take 1 capsule by mouth daily.      . clopidogrel (PLAVIX) 75 MG tablet Take 75 mg by mouth daily.     . digoxin (LANOXIN) 0.125 MG tablet Take 125 mcg by mouth daily.      Marland Kitchen ENSURE (ENSURE) Take 237 mLs by mouth daily.     . rosuvastatin (CRESTOR) 20 MG tablet Take 20 mg by mouth daily.    . sertraline (ZOLOFT) 50 MG tablet Take 50 mg by mouth Daily.    . traMADol (ULTRAM) 50 MG tablet Take 50 mg by mouth every 8 (eight) hours as needed for moderate pain.      No current facility-administered medications for this visit.     Past Medical History  Diagnosis Date  . Ischemic cardiomyopathy December 2006    a. 10/2013 Echo: EF 25-30%;  b. 11/2013 MDT single Lead ICD gen change and lead revision.  . Chronic systolic CHF (congestive heart failure), NYHA class 2 ([place])     a. Dx 05/2005;  b. 10/2013 Echo: EF 25-30%, DK and scarring of mid-distal antsept, inf, infsept, & apical myocardium. Severe HK of anterior wall. Gr 1 DD.  Marland Kitchen Hypertension   . Dyslipidemia   . Uterine fibroid   . Depression   . 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 CVA (cerebral vascular accident) [facility]) 2002, 2008, "mini-stroke"  Oct 2016    Dr. Rob Hickman; "LUE very weak; left leg a  little weak but can still walk"   . Deafness in left ear     ROS:   All systems reviewed and negative except as noted in the HPI.   Past Surgical History  Procedure Laterality Date  . Cardiac defibrillator placement  05/2005; 11/19/2013    MDT ICD implanted 2006 by Dr Lovena Le with [phone] lead; gen change and RV lead revision 11/2013 by Dr Lovena Le  . Tonsillectomy    . Appendectomy    . Cholecystectomy    . Abdominal hysterectomy      "partial"  . Implantable cardioverter defibrillator revision N/A 11/19/2013    Procedure: IMPLANTABLE CARDIOVERTER DEFIBRILLATOR REVISION;  Surgeon: Evans Lance, MD;  Location: [facility] CATH LAB;  Service: Cardiovascular;  Laterality: N/A;     Family History  Problem Relation Age of Onset  . Heart disease Mother 42    Cerebrovascular Accident  . Mental retardation Sister      Social History   Social History  . Marital Status: Legally Separated    Spouse Name: N/A  . Number of Children: N/A  . Years of Education: N/A   Occupational History  . disabled    Social History Main Topics  . Smoking status: Former Smoker -- 0.10 packs/day for 42 years  Types: Cigarettes    Quit date: 06/18/2007  . Smokeless tobacco: Never Used  . Alcohol Use: No     Comment: "stopped drinking alcohol in the 1990's"  . Drug Use: No     Comment: "stopped using drugs "before 2009"  . Sexual Activity: No   Other Topics Concern  . Not on file   Social History Narrative     BP 108/68 mmHg  Pulse 84  Ht 5' (1.524 m)  Wt 108 lb (48.988 kg)  BMI 21.09 kg/m2  SpO2 95%  Physical Exam:  stable appearing middle-age woman,NAD HEENT: Unremarkable Neck:  7 cm JVD, no thyromegally Back:  No CVA tenderness Lungs:  Clear with no wheezes, rales, or rhonchi. HEART:  Regular rate rhythm, no murmurs, no rubs, no clicks Abd:  soft, positive bowel sounds, no organomegally, no rebound, no guarding Ext:  2 plus pulses, no edema, no cyanosis, no clubbing Skin:  No rashes no  nodules Neuro:  CN II through XII intact, motor grossly intact  DEVICE  Normal device function.  See PaceArt for details.   Assess/Plan:

## 2015-05-25 NOTE — Assessment & Plan Note (Signed)
She has had a recent stress test which demonstrated scar and very minimal ischemia with EF 27%. All stable.

## 2015-05-25 NOTE — Patient Instructions (Signed)
Medication Instructions:  Your physician recommends that you continue on your current medications as directed. Please refer to the Current Medication list given to you today.   Labwork: None ordered   Testing/Procedures: None ordered   Follow-Up: Your physician wants you to follow-up in: 12 months with Dr Taylor You will receive a reminder letter in the mail two months in advance. If you don't receive a letter, please call our office to schedule the follow-up appointment.  Remote monitoring is used to monitor your  ICD from home. This monitoring reduces the number of office visits required to check your device to one time per year. It allows us to keep an eye on the functioning of your device to ensure it is working properly. You are scheduled for a device check from home on 08/24/15. You may send your transmission at any time that day. If you have a wireless device, the transmission will be sent automatically. After your physician reviews your transmission, you will receive a postcard with your next transmission date.    Any Other Special Instructions Will Be Listed Below (If Applicable).     If you need a refill on your cardiac medications before your next appointment, please call your pharmacy.   

## 2015-05-25 NOTE — Assessment & Plan Note (Signed)
Her symptoms remain class 2. No change in meds.

## 2015-05-25 NOTE — Assessment & Plan Note (Signed)
Her Medtronic VVI ICD is working normally. Will recheck in several months.

## 2015-06-26 ENCOUNTER — Telehealth: Payer: Self-pay | Admitting: Cardiology

## 2015-06-26 ENCOUNTER — Ambulatory Visit (INDEPENDENT_AMBULATORY_CARE_PROVIDER_SITE_OTHER): Payer: Medicare Other

## 2015-06-26 DIAGNOSIS — I5022 Chronic systolic (congestive) heart failure: Secondary | ICD-10-CM | POA: Diagnosis not present

## 2015-06-26 DIAGNOSIS — Z95810 Presence of automatic (implantable) cardiac defibrillator: Secondary | ICD-10-CM | POA: Diagnosis not present

## 2015-06-26 NOTE — Telephone Encounter (Signed)
Spoke with pt and reminded pt of remote transmission that is due today. Pt verbalized understanding.   

## 2015-06-27 NOTE — Progress Notes (Signed)
EPIC Encounter for ICM Monitoring  Patient Name: [name] is a 70 y.o. female Date: 06/27/2015 Primary Care Physican: Shamleffer, Herschell Dimes, MD Primary Cardiologist: Lovena Le Electrophysiologist: Lovena Le Dry Weight: 108 lb       In the past month, have you:  1. Gained more than 2 pounds in a day or more than 5 pounds in a week? no  2. Had changes in your medications (with verification of current medications)? no  3. Had more shortness of breath than is usual for you? no  4. Limited your activity because of shortness of breath? no  5. Not been able to sleep because of shortness of breath? no  6. Had increased swelling in your feet or ankles? no  7. Had symptoms of dehydration (dizziness, dry mouth, increased thirst, decreased urine output) no  8. Had changes in sodium restriction? no  9. Been compliant with medication? Yes   ICM trend: 3 month view 06/26/2015  ICM trend: 1 year view 06/26/2015   Follow-up plan: ICM clinic phone appointment on 07/31/2015.  1st ICM encounter.  Patient requested I speak to her caregiver Crystal.  Optivol impedance slightly below baseline during December and 06/22/2015 and returning to baseline on 06/26/2015.  Patient's caregiver asked patient regarding HF symptoms and denied any at this time or during December.  She stated she was doing well at this time.  Education given on low salt diet.  No changes today.  Patient currently not prescribed a diuretic.  Patient's monitor is in the living room.  Advised to move monitor by beside so the transmission can be sent automatically.   Copy of note sent to patient's primary care physician, primary cardiologist, and device following physician.  [name], RN, CCM 06/27/2015 3:04 PM

## 2015-07-31 ENCOUNTER — Ambulatory Visit (INDEPENDENT_AMBULATORY_CARE_PROVIDER_SITE_OTHER): Payer: Medicare Other

## 2015-07-31 DIAGNOSIS — Z95810 Presence of automatic (implantable) cardiac defibrillator: Secondary | ICD-10-CM

## 2015-07-31 DIAGNOSIS — I5022 Chronic systolic (congestive) heart failure: Secondary | ICD-10-CM

## 2015-07-31 NOTE — Progress Notes (Signed)
EPIC Encounter for ICM Monitoring  Patient Name: [name] is a 70 y.o. female Date: 07/31/2015 Primary Care Physican: Shamleffer, Herschell Dimes, MD Primary Cardiologist: Lovena Le Electrophysiologist: Lovena Le Dry Weight: 109 lbs 2 weeks ago in physician office.        In the past month, have you:  1. Gained more than 2 pounds in a day or more than 5 pounds in a week? Does not weigh at home  2. Had changes in your medications (with verification of current medications)? no  3. Had more shortness of breath than is usual for you? no  4. Limited your activity because of shortness of breath? no  5. Not been able to sleep because of shortness of breath? no  6. Had increased swelling in your feet or ankles? no  7. Had symptoms of dehydration (dizziness, dry mouth, increased thirst, decreased urine output) no  8. Had changes in sodium restriction? no  9. Been compliant with medication? Yes   ICM trend: 3 month view for 07/31/2015   ICM trend: 1 year view for 07/31/2015   Follow-up plan: ICM clinic phone appointment 09/05/2015.   Patient gave her caregiver the phone and spoke with Crystal because she is [facility].  Optivol thoracic impedance trending along reference line.  Patient has no fluid symptoms at this time and doing well.  No changes today.    Copy of note sent to patient's primary care physician, primary cardiologist, and device following physician.  [name], RN, CCM 07/31/2015 1:36 PM

## 2015-09-05 ENCOUNTER — Ambulatory Visit (INDEPENDENT_AMBULATORY_CARE_PROVIDER_SITE_OTHER): Payer: Medicare Other | Admitting: *Deleted

## 2015-09-05 DIAGNOSIS — I5022 Chronic systolic (congestive) heart failure: Secondary | ICD-10-CM

## 2015-09-05 DIAGNOSIS — I429 Cardiomyopathy, unspecified: Secondary | ICD-10-CM | POA: Diagnosis not present

## 2015-09-05 DIAGNOSIS — Z95810 Presence of automatic (implantable) cardiac defibrillator: Secondary | ICD-10-CM

## 2015-09-05 NOTE — Progress Notes (Signed)
EPIC Encounter for ICM Monitoring  Patient Name: [name] is a 70 y.o. female Date: 09/05/2015 Primary Care Physican: No primary care provider on file. Primary Cardiologist: Lovena Le Electrophysiologist: Lovena Le Dry Weight: 109 lb   In the past month, have you:  1. Gained more than 2 pounds in a day or more than 5 pounds in a week? no  2. Had changes in your medications (with verification of current medications)? no  3. Had more shortness of breath than is usual for you? no  4. Limited your activity because of shortness of breath? no  5. Not been able to sleep because of shortness of breath? no  6. Had increased swelling in your feet or ankles? no  7. Had symptoms of dehydration (dizziness, dry mouth, increased thirst, decreased urine output) no  8. Had changes in sodium restriction? no  9. Been compliant with medication? Yes   ICM trend: 3 month view for 09/05/2015   ICM trend: 1 year view for 09/05/2015   Follow-up plan: ICM clinic phone appointment on 10/10/2015.  Thoracic impedance below reference line from 08/18/2015 to 08/27/2015 suggesting fluid accumulation.  Patient denied any fluid symptoms.  Thoracic impedance returned to reference line 08/28/2015 suggesting stable fluid levels.  Patient very [place] and preferred for caregiver to speak with me.  Encouraged to call for any fluid symptoms.  No changes today.     [name], RN, CCM 09/05/2015 2:10 PM

## 2015-09-06 NOTE — Progress Notes (Signed)
Remote ICD transmission.   

## 2015-10-05 LAB — CUP PACEART REMOTE DEVICE CHECK
Battery Remaining Longevity: 128 mo
Battery Voltage: 3.01 V
Brady Statistic RV Percent Paced: 0.01 %
HighPow Impedance: 49 Ohm
Implantable Lead Location: 753860
Lead Channel Impedance Value: 361 Ohm
Lead Channel Sensing Intrinsic Amplitude: 10.875 mV
Lead Channel Sensing Intrinsic Amplitude: 10.875 mV
Lead Channel Setting Pacing Amplitude: 2.5 V
Lead Channel Setting Pacing Pulse Width: 0.4 ms
MDC IDC LEAD IMPLANT DT: 20150605
MDC IDC MSMT LEADCHNL RV IMPEDANCE VALUE: 304 Ohm
MDC IDC MSMT LEADCHNL RV PACING THRESHOLD AMPLITUDE: 0.625 V
MDC IDC MSMT LEADCHNL RV PACING THRESHOLD PULSEWIDTH: 0.4 ms
MDC IDC SESS DTM: 20170321143624
MDC IDC SET LEADCHNL RV SENSING SENSITIVITY: 0.3 mV

## 2015-10-06 ENCOUNTER — Encounter: Payer: Self-pay | Admitting: Cardiology

## 2015-10-10 ENCOUNTER — Ambulatory Visit (INDEPENDENT_AMBULATORY_CARE_PROVIDER_SITE_OTHER): Payer: Medicare Other

## 2015-10-10 DIAGNOSIS — I5022 Chronic systolic (congestive) heart failure: Secondary | ICD-10-CM | POA: Diagnosis not present

## 2015-10-10 DIAGNOSIS — Z95810 Presence of automatic (implantable) cardiac defibrillator: Secondary | ICD-10-CM | POA: Diagnosis not present

## 2015-10-10 NOTE — Progress Notes (Signed)
EPIC Encounter for ICM Monitoring  Patient Name: [name] is a 70 y.o. female Date: 10/10/2015 Primary Care Physican: Shamleffer, Herschell Dimes, MD Primary Cardiologist: Lovena Le Electrophysiologist: Lovena Le Dry Weight: unknown       In the past month, have you:  1. Gained more than 2 pounds in a day or more than 5 pounds in a week? no  2. Had changes in your medications (with verification of current medications)? Yes, sister advised meds have changed but she was unsure of the changes.  Advised to bring all patients med bottles to Dr Forde Dandy appointment for review.  3. Had more shortness of breath than is usual for you? Yes and was hospitalized  4. Limited your activity because of shortness of breath? no  5. Not been able to sleep because of shortness of breath? no  6. Had increased swelling in your feet or ankles? no  7. Had symptoms of dehydration (dizziness, dry mouth, increased thirst, decreased urine output) no  8. Had changes in sodium restriction? no  9. Been compliant with medication? Yes  ICM trend: 3 month view for 10/10/2015  ICM trend: 1 year view for 10/10/2015  Follow-up plan: ICM clinic phone appointment 10/31/2015 and office appointment with Dr Lovena Le on 10/17/2015.  Spoke with Paulette, patients sister (DPR).  She reported patient was in [place] [facility] for a week approximately 09/18/2015 for pneumonia.  She reported patient had appointment with Dr Kelton Pillar 10/05/2015 and today, 10/10/2015.  She stated patient is still have difficulty with breathing and does not think the pneumonia has resolved.  She is using oxygen at home.  Explained the device suggests she still has some fluid which could be related to pneumonia but also may be some fluid overload.  She stated PCP checked her legs today and no swelling.  She stated is having chest x-ray today.     FLUID LEVELS: Optivol thoracic impedance decreased 09/17/2015 to 10/10/2015 suggesting fluid accumulation.     SYMPTOMS: Advised if patient has weight gain, breathing worsens or she gets lower extremity swelling to call for any of these symptoms.  Advised will recheck her fluid levels 2 weeks after her appointment with Dr Lovena Le.   Patient currently not prescribed diuretic.    Reviewed transmission and patients current condition with Dr Caryl Comes in the office and he advised to have patient to follow Dr [facility] treatment plan and keep appointment with Dr Lovena Le in the office next week.   Advised will send to Dr. Lovena Le for review.  Copy of note sent to PCP.  [name], RN, CCM 10/10/2015 3:29 PM

## 2015-10-17 ENCOUNTER — Ambulatory Visit (INDEPENDENT_AMBULATORY_CARE_PROVIDER_SITE_OTHER): Payer: Medicare Other | Admitting: Internal Medicine

## 2015-10-17 ENCOUNTER — Encounter: Payer: Self-pay | Admitting: Internal Medicine

## 2015-10-17 VITALS — BP 124/68 | HR 68 | Ht 60.0 in | Wt 111.8 lb

## 2015-10-17 DIAGNOSIS — I2510 Atherosclerotic heart disease of native coronary artery without angina pectoris: Secondary | ICD-10-CM | POA: Diagnosis not present

## 2015-10-17 DIAGNOSIS — I5022 Chronic systolic (congestive) heart failure: Secondary | ICD-10-CM

## 2015-10-17 DIAGNOSIS — I2583 Coronary atherosclerosis due to lipid rich plaque: Secondary | ICD-10-CM

## 2015-10-17 LAB — CUP PACEART INCLINIC DEVICE CHECK
Battery Remaining Longevity: 127 mo
Battery Voltage: 3.02 V
Date Time Interrogation Session: 20170502151631
HIGH POWER IMPEDANCE MEASURED VALUE: 31 Ohm
Implantable Lead Location: 753860
Lead Channel Pacing Threshold Pulse Width: 0.4 ms
Lead Channel Setting Pacing Pulse Width: 0.4 ms
Lead Channel Setting Sensing Sensitivity: 0.3 mV
MDC IDC LEAD IMPLANT DT: 20150605
MDC IDC MSMT LEADCHNL RV IMPEDANCE VALUE: 285 Ohm
MDC IDC MSMT LEADCHNL RV IMPEDANCE VALUE: 342 Ohm
MDC IDC MSMT LEADCHNL RV PACING THRESHOLD AMPLITUDE: 0.75 V
MDC IDC MSMT LEADCHNL RV SENSING INTR AMPL: 13.25 mV
MDC IDC SET LEADCHNL RV PACING AMPLITUDE: 2.5 V
MDC IDC STAT BRADY RV PERCENT PACED: 0.01 %

## 2015-10-17 MED ORDER — FUROSEMIDE 20 MG PO TABS: ORAL_TABLET | ORAL | Status: DC

## 2015-10-17 NOTE — Progress Notes (Signed)
HPI Ms. Aries returns today for followup. She is a very pleasant 70 year old woman with a ischemic cardiomyopathy, chronic class 2 systolic heart failure, status post ICD implantation. The patient has undergone ICD generator change out with lead revision.  She denies chest pain, shortness of breath, or peripheral edema. No syncope. She is fairly sedentary. No ICD shock. She was in the hospital with pneumonia. She feels better. She would like to get off of her oxygen by nasal cannula. Allergies  Allergen Reactions  . Beta Adrenergic Blockers Other (See Comments)    fatigue     Current Outpatient Prescriptions  Medication Sig Dispense Refill  . ADVAIR DISKUS 100-50 MCG/DOSE AEPB Inhale 1 puff into the lungs as directed.  0  . aspirin 81 MG EC tablet Take 81 mg by mouth daily.  3  . baclofen (LIORESAL) 10 MG tablet     . benazepril (LOTENSIN) 5 MG tablet Take 5 mg by mouth Daily.    . carvedilol (COREG) 3.125 MG tablet Take 3.125 mg by mouth 2 (two) times daily.  0  . Cholecalciferol (VITAMIN D) 2000 UNITS CAPS Take 1 capsule by mouth daily.      . clopidogrel (PLAVIX) 75 MG tablet Take 75 mg by mouth daily.     . digoxin (LANOXIN) 0.125 MG tablet Take 125 mcg by mouth daily.      Marland Kitchen ENSURE (ENSURE) Take 237 mLs by mouth daily.     . rosuvastatin (CRESTOR) 20 MG tablet Take 20 mg by mouth daily.    . sertraline (ZOLOFT) 50 MG tablet Take 50 mg by mouth Daily.    . traMADol (ULTRAM) 50 MG tablet Take 50 mg by mouth every 8 (eight) hours as needed for moderate pain.      No current facility-administered medications for this visit.     Past Medical History  Diagnosis Date  . Ischemic cardiomyopathy December 2006    a. 10/2013 Echo: EF 25-30%;  b. 11/2013 MDT single Lead ICD gen change and lead revision.  . Chronic systolic CHF (congestive heart failure), NYHA class 2 ([facility])     a. Dx 05/2005;  b. 10/2013 Echo: EF 25-30%, DK and scarring of mid-distal antsept, inf, infsept, & apical  myocardium. Severe HK of anterior wall. Gr 1 DD.  Marland Kitchen Hypertension   . Dyslipidemia   . Uterine fibroid   . Depression   . 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 CVA (cerebral vascular accident) [facility]) 2002, 2008, "mini-stroke"  Oct 2016    Dr. Rob Hickman; "LUE very weak; left leg a little weak but can still walk"   . Deafness in left ear     ROS:   All systems reviewed and negative except as noted in the HPI.   Past Surgical History  Procedure Laterality Date  . Cardiac defibrillator placement  05/2005; 11/19/2013    MDT ICD implanted 2006 by Dr Lovena Le with [phone] lead; gen change and RV lead revision 11/2013 by Dr Lovena Le  . Tonsillectomy    . Appendectomy    . Cholecystectomy    . Abdominal hysterectomy      "partial"  . Implantable cardioverter defibrillator revision N/A 11/19/2013    Procedure: IMPLANTABLE CARDIOVERTER DEFIBRILLATOR REVISION;  Surgeon: Evans Lance, MD;  Location: [facility] CATH LAB;  Service: Cardiovascular;  Laterality: N/A;     Family History  Problem Relation Age of Onset  . Heart disease Mother 37  Cerebrovascular Accident  . Mental retardation Sister      Social History   Social History  . Marital Status: Legally Separated    Spouse Name: N/A  . Number of Children: N/A  . Years of Education: N/A   Occupational History  . disabled    Social History Main Topics  . Smoking status: Former Smoker -- 0.10 packs/day for 42 years    Types: Cigarettes    Quit date: 06/18/2007  . Smokeless tobacco: Never Used  . Alcohol Use: No     Comment: "stopped drinking alcohol in the 1990's"  . Drug Use: No     Comment: "stopped using drugs "before 2009"  . Sexual Activity: No   Other Topics Concern  . Not on file   Social History Narrative     BP 124/68 mmHg  Pulse 68  Ht 5' (1.524 m)  Wt 111 lb 12.8 oz (50.712 kg)  BMI 21.83 kg/m2  SpO2 99%  Physical Exam:  stable appearing  middle-age woman,NAD HEENT: Unremarkable except for poor dentition. Neck:  8 cm JVD, no thyromegally Back:  No CVA tenderness Lungs:  Clear with no wheezes, rales, or rhonchi. Well healed ICD incision. HEART:  Regular rate rhythm, no murmurs, no rubs, no clicks Abd:  soft, positive bowel sounds, no organomegally, no rebound, no guarding Ext:  2 plus pulses, no edema, no cyanosis, no clubbing Skin:  No rashes no nodules Neuro:  CN II through XII intact, motor grossly intact  DEVICE  Normal device function.  See PaceArt for details.   Assess/Plan: 1. Chronic systolic heart failure - while she denies sob, her optivol is up and her neck veins are as well. We will give her 3 days of low dose lasix. She is encouraged to reduce her salt intake. 2. HTN - her bloo pressure is well controlled. Will follow. 3. ICD - her Medtronic device is working normally. Will follow. 4. CAD - she is s/p MI remotely. She denies anginal symptoms.   Mikle Bosworth.D.

## 2015-10-17 NOTE — Patient Instructions (Addendum)
Medication Instructions:  Take Furosemide 20 mg by mouth for the next 3 days only  Labwork: None ordered   Testing/Procedures: None ordered   Follow-Up: Your physician wants you to follow-up in: 12 months with Dr Knox Saliva will receive a reminder letter in the mail two months in advance. If you don't receive a letter, please call our office to schedule the follow-up appointment.  Remote monitoring is used to monitor your  ICD from home. This monitoring reduces the number of office visits required to check your device to one time per year. It allows [place] to keep an eye on the functioning of your device to ensure it is working properly. You are scheduled for a device check from home on 01/16/16. You may send your transmission at any time that day. If you have a wireless device, the transmission will be sent automatically. After your physician reviews your transmission, you will receive a postcard with your next transmission date.     Any Other Special Instructions Will Be Listed Below (If Applicable).     If you need a refill on your cardiac medications before your next appointment, please call your pharmacy.

## 2015-10-31 ENCOUNTER — Ambulatory Visit (INDEPENDENT_AMBULATORY_CARE_PROVIDER_SITE_OTHER): Payer: Medicare Other

## 2015-10-31 ENCOUNTER — Telehealth: Payer: Self-pay

## 2015-10-31 DIAGNOSIS — Z95810 Presence of automatic (implantable) cardiac defibrillator: Secondary | ICD-10-CM | POA: Diagnosis not present

## 2015-10-31 DIAGNOSIS — I5022 Chronic systolic (congestive) heart failure: Secondary | ICD-10-CM

## 2015-10-31 NOTE — Telephone Encounter (Signed)
Received call from patients sister, [name] [facility]).  She reported the monitor was unplugged and asked about how to reset it so she can send a remote transmission.  Explained how to send remote transmission.

## 2015-10-31 NOTE — Progress Notes (Signed)
EPIC Encounter for ICM Monitoring  Patient Name: [name] is a 70 y.o. female Date: 10/31/2015 Primary Care Physican: No primary care provider on file. Primary Cardiologist: Lovena Le Electrophysiologist: Lovena Le Dry Weight: 111 lbs last week at MD office      In the past month, have you:  1. Gained more than 2 pounds in a day or more than 5 pounds in a week? no  2. Had changes in your medications (with verification of current medications)? no  3. Had more shortness of breath than is usual for you? no  4. Limited your activity because of shortness of breath? no  5. Not been able to sleep because of shortness of breath? no  6. Had increased swelling in your feet, ankles, legs or stomach area? no  7. Had symptoms of dehydration (dizziness, dry mouth, increased thirst, decreased urine output) no  8. Had changes in sodium restriction? Does not adhere to low sodium diet  9. Been compliant with medication? Yes  ICM trend: 3 month view for 10/31/2015   ICM trend: 1 year view for 10/31/2015   Follow-up plan: ICM clinic phone appointment 11/08/2015.  Spoke with sister, Jeanne Ivan (DPR).    FLUID LEVELS:  Optivol thoracic impedance decreased 09/16/2015 to 10/31/2015 suggesting fluid accumulation.  Patient was hospitalized first week of April for pneumonia which correlates with the beginning of fluid accumulation.  Follow office visit with Dr Lovena Le on 10/17/2015 and was ordered Lasix x 3 days due to volume overload with very little improvement of Optivol.    SYMPTOMS:   Sister reported patient is not having any fluid symptoms.  Asked if patient has increased sodium intake since she was discharged from the hospital.   She stated she knows she has been eating potato chips and explained that would be high in salt.     EDUCATION:   Explained patient should limit salt intake to < 2000 mg and fluid intake to 64 oz daily.   Explained high sodium foods can cause fluid accumulation.      Advised will  send to Dr Lovena Le for review continued decreased thoracic impedance since office visit on 10/17/2015.  Advised will call back with recommendations.   Repeat transmission on 11/08/2015.    [name], RN, CCM 10/31/2015 10:59 AM

## 2015-11-01 NOTE — Progress Notes (Signed)
Reviewed transmission with Dr Lovena Le in the office today.  Spoke with sister, [name] [facility]).  Advised Dr Lovena Le ordered Furosemide 20 mg bid x 5 days only and then discontinue.  Advised to have patient decrease sodium food intake. Repeat transmission on 11/08/2015.  She verbalized understanding.

## 2015-11-08 ENCOUNTER — Ambulatory Visit (INDEPENDENT_AMBULATORY_CARE_PROVIDER_SITE_OTHER): Payer: Medicare Other

## 2015-11-08 DIAGNOSIS — Z95810 Presence of automatic (implantable) cardiac defibrillator: Secondary | ICD-10-CM

## 2015-11-08 DIAGNOSIS — I5022 Chronic systolic (congestive) heart failure: Secondary | ICD-10-CM

## 2015-11-09 NOTE — Progress Notes (Signed)
EPIC Encounter for ICM Monitoring  Patient Name: [name] is a 70 y.o. female Date: 11/09/2015 Primary Care Physican: No primary care provider on file. Primary Cardiologist: Lovena Le Electrophysiologist: Lovena Le Dry Weight: unknown      In the past month, have you:  1. Gained more than 2 pounds in a day or more than 5 pounds in a week? unknown  2. Had changes in your medications (with verification of current medications)? no  3. Had more shortness of breath than is usual for you? no  4. Limited your activity because of shortness of breath? no  5. Not been able to sleep because of shortness of breath? no  6. Had increased swelling in your feet, ankles, legs or stomach area? no  7. Had symptoms of dehydration (dizziness, dry mouth, increased thirst, decreased urine output) no  8. Had changes in sodium restriction? no  9. Been compliant with medication? Yes  ICM trend: 3 month view for 11/08/2015   ICM trend: 1 year view for 11/08/2015   Follow-up plan: ICM clinic phone appointment 12/05/2015.   Spoke with sister [name] [facility]).  FLUID LEVELS:  Since last ICM transmission on 10/31/2015, Optivol thoracic impedance improved and at baseline after taking 5 days of Furosemide.    SYMPTOMS:  She reported patient is feeling fine and denied any symptoms such as weight gain of 3 pounds overnight or 5 pounds within a week, SOB and/or lower extremity swelling. Encouraged to call for any fluid symptoms.   RECOMMENDATIONS: No changes today.    Advised will send the updated ICM remote transmission to Dr Lovena Le regarding impedance has returned to baseline.     [name], RN, CCM 11/09/2015 9:29 AM

## 2015-12-05 ENCOUNTER — Ambulatory Visit (INDEPENDENT_AMBULATORY_CARE_PROVIDER_SITE_OTHER): Payer: Medicare Other | Admitting: *Deleted

## 2015-12-05 ENCOUNTER — Telehealth: Payer: Self-pay

## 2015-12-05 DIAGNOSIS — I5022 Chronic systolic (congestive) heart failure: Secondary | ICD-10-CM

## 2015-12-05 DIAGNOSIS — I429 Cardiomyopathy, unspecified: Secondary | ICD-10-CM | POA: Diagnosis not present

## 2015-12-05 DIAGNOSIS — Z95810 Presence of automatic (implantable) cardiac defibrillator: Secondary | ICD-10-CM

## 2015-12-05 NOTE — Progress Notes (Signed)
Remote ICD transmission.   

## 2015-12-05 NOTE — Telephone Encounter (Signed)
Spoke with [name], patient's sister.  Requested an ICM transmission be sent today since was not received automatically overnight.   She stated she is on her way to work and will get it sent sometime today but maybe later in the day.

## 2015-12-06 NOTE — Progress Notes (Addendum)
EPIC Encounter for ICM Monitoring  Patient Name: [name] is a 70 y.o. female Date: 12/06/2015 Primary Care Physican: No primary care provider on file. Primary Cardiologist: Lovena Le Electrophysiologist: Lovena Le Dry Weight: unknown      In the past month, have you:  1. Gained more than 2 pounds in a day or more than 5 pounds in a week? No  2. Had changes in your medications (with verification of current medications)? No  3. Had more shortness of breath than is usual for you? No   4. Limited your activity because of shortness of breath? No   5. Not been able to sleep because of shortness of breath? No   6. Had increased swelling in your feet, ankles, legs or stomach area? No   7. Had symptoms of dehydration (dizziness, dry mouth, increased thirst, decreased urine output) No   8. Had changes in sodium restriction? Unsure if she follows low sodium diet  9. Been compliant with medication? Yes   ICM trend: 3 month view for 12/06/2015   ICM trend: 1 year view for 12/06/2015   Follow-up plan: ICM clinic phone appointment 12/14/2015.  Spoke with sister [name] [facility]).   FLUID LEVELS: Optivol thoracic impedance decreased 11/08/2015 to 6/7//2017 and 11/27/2015 to 12/06/2015 suggesting fluid accumulation.    SYMPTOMS:  She stated she does not think her sister has any fluid symptoms but will call to make sure she is doing ok.  Encouraged to call for any fluid symptoms.   EDUCATION:   Advised to limit sodium intake to < 2000 mg since high sodium diet may cause fluid retention.     RECOMMENDATIONS:  Advised will send to Dr Lovena Le for review regarding decreased thoracic impedance.  Patient asymptomatic.  Has taken Furosemide for 3 days only after last office visit, with Dr Lovena Le, on 10/17/2015 but is not ordered prn.  Will call her back with any recommendations.       [name], RN, CCM 12/06/2015 2:39 PM

## 2015-12-07 NOTE — Progress Notes (Signed)
Reviewed transmission with Dr Lovena Le in the office.  He recommended to continue to monitor and no changes today.  He recommended to repeat ICM transmission in approximately a month.  ICM remote Transmission scheduled for 01/16/2016.

## 2016-01-16 ENCOUNTER — Ambulatory Visit (INDEPENDENT_AMBULATORY_CARE_PROVIDER_SITE_OTHER): Payer: Medicare Other | Admitting: *Deleted

## 2016-01-16 DIAGNOSIS — Z95810 Presence of automatic (implantable) cardiac defibrillator: Secondary | ICD-10-CM

## 2016-01-16 DIAGNOSIS — I429 Cardiomyopathy, unspecified: Secondary | ICD-10-CM

## 2016-01-16 DIAGNOSIS — I5022 Chronic systolic (congestive) heart failure: Secondary | ICD-10-CM | POA: Diagnosis not present

## 2016-01-16 NOTE — Progress Notes (Signed)
EPIC Encounter for ICM Monitoring  Patient Name: [name] is a 70 y.o. female Date: 01/16/2016 Primary Care Physican: No primary care provider on file. Primary Cardiologist: Lovena Le Electrophysiologist: Lovena Le Dry Weight: unknown       Spoke with sister, [name] (DPR). Heart Failure questions reviewed, pt asymptomatic  Thoracic impedance stable.  Recommendations: Sister stated patient's PCP has prescribed Furosemide daily.  Unsure of exact dosage.  She reported PCP is Dr Ernestine Conrad (unable to locate phone number and name).  She stated labs were drawn in the past week and will be rechecked on 01/26/2016.     ICM trend: 01/16/2016     Follow-up plan: ICM clinic phone appointment on 02/16/2016.  Copy of ICM check sent to device physician.   [name], RN 01/16/2016 1:16 PM

## 2016-01-16 NOTE — Progress Notes (Signed)
Remote ICD transmission.   

## 2016-01-23 ENCOUNTER — Encounter: Payer: Self-pay | Admitting: Cardiology

## 2016-01-30 LAB — CUP PACEART REMOTE DEVICE CHECK
Battery Remaining Longevity: 126 mo
Battery Voltage: 3.01 V
Date Time Interrogation Session: 20170801133425
HIGH POWER IMPEDANCE MEASURED VALUE: 45 Ohm
Lead Channel Impedance Value: 285 Ohm
Lead Channel Sensing Intrinsic Amplitude: 10.5 mV
Lead Channel Sensing Intrinsic Amplitude: 10.5 mV
Lead Channel Setting Pacing Amplitude: 2.5 V
Lead Channel Setting Pacing Pulse Width: 0.4 ms
MDC IDC LEAD IMPLANT DT: 20150605
MDC IDC LEAD LOCATION: 753860
MDC IDC MSMT LEADCHNL RV IMPEDANCE VALUE: 361 Ohm
MDC IDC MSMT LEADCHNL RV PACING THRESHOLD AMPLITUDE: 0.625 V
MDC IDC MSMT LEADCHNL RV PACING THRESHOLD PULSEWIDTH: 0.4 ms
MDC IDC SET LEADCHNL RV SENSING SENSITIVITY: 0.3 mV
MDC IDC STAT BRADY RV PERCENT PACED: 0.01 %

## 2016-02-16 ENCOUNTER — Ambulatory Visit (INDEPENDENT_AMBULATORY_CARE_PROVIDER_SITE_OTHER): Payer: Medicare Other

## 2016-02-16 DIAGNOSIS — Z95810 Presence of automatic (implantable) cardiac defibrillator: Secondary | ICD-10-CM

## 2016-02-16 DIAGNOSIS — I5022 Chronic systolic (congestive) heart failure: Secondary | ICD-10-CM | POA: Diagnosis not present

## 2016-02-16 NOTE — Progress Notes (Signed)
EPIC Encounter for ICM Monitoring  Patient Name: [name] is a 70 y.o. female Date: 02/16/2016 Primary Care Physican: No primary care provider on file. Primary Cardiologist: Lovena Le Electrophysiologist: Lovena Le Dry Weight: unknown       Spoke with sister [name] (DPR).  Heart Failure questions reviewed, pt asymptomatic   Thoracic impedance trending close to baseline and normal.  Recommendations: No changes.  Low sodium diet education provided.    Follow-up plan: ICM clinic phone appointment on 03/20/2016.  Copy of ICM check sent to device physician.   ICM trend: 02/16/2016       [name], RN 02/16/2016 9:20 AM

## 2016-03-20 ENCOUNTER — Ambulatory Visit (INDEPENDENT_AMBULATORY_CARE_PROVIDER_SITE_OTHER): Payer: Medicare Other

## 2016-03-20 DIAGNOSIS — I5022 Chronic systolic (congestive) heart failure: Secondary | ICD-10-CM | POA: Diagnosis not present

## 2016-03-20 DIAGNOSIS — Z95810 Presence of automatic (implantable) cardiac defibrillator: Secondary | ICD-10-CM | POA: Diagnosis not present

## 2016-03-20 NOTE — Progress Notes (Signed)
EPIC Encounter for ICM Monitoring  Patient Name: [name] is a 70 y.o. female Date: 03/20/2016 Primary Care Physican: No primary care provider on file. Primary Cardiologist:Taylor Electrophysiologist: Lovena Le Dry Weight:  unknown       Spoke with sister [name] (DPR).  Heart Failure questions reviewed, pt asymptomatic   Thoracic impedance slightly abnormal suggesting fluid accumulation.  Recommendations: No changes.  Low sodium diet education provided.    Follow-up plan: ICM clinic phone appointment on 04/23/2016.  Copy of ICM check sent to device physician.   ICM trend: 03/20/2016       [name], RN 03/20/2016 4:38 PM

## 2016-04-24 ENCOUNTER — Ambulatory Visit (INDEPENDENT_AMBULATORY_CARE_PROVIDER_SITE_OTHER): Payer: Medicare Other | Admitting: *Deleted

## 2016-04-24 DIAGNOSIS — I5022 Chronic systolic (congestive) heart failure: Secondary | ICD-10-CM

## 2016-04-24 DIAGNOSIS — Z95810 Presence of automatic (implantable) cardiac defibrillator: Secondary | ICD-10-CM | POA: Diagnosis not present

## 2016-04-24 DIAGNOSIS — I429 Cardiomyopathy, unspecified: Secondary | ICD-10-CM

## 2016-04-24 NOTE — Progress Notes (Signed)
Remote ICD transmission.   

## 2016-04-25 ENCOUNTER — Encounter: Payer: Self-pay | Admitting: Cardiology

## 2016-04-25 NOTE — Progress Notes (Addendum)
EPIC Encounter for ICM Monitoring  Patient Name: [name] is a 70 y.o. female Date: 04/25/2016 Primary Care Physican: [facility] [place] [facility] [place] ( [place] [phone]) Electrophysiologist: Lovena Le Dry Weight:     111 lbs      Spoke with sister [name] (DPR).  Heart Failure questions reviewed, pt asymptomatic.  [facility] medical [facility].  Phone number is [phone].  Patient's cardiologist is Dr Sherie Don and called to obtain any recent lab results and [place] answering phone stated her last visit was 2016.    Thoracic impedance abnormal suggesting fluid accumulation.  Labs: 01/26/2016 Creatinine 1.14, BUN 23, Potassium 4.2, Sodium 142 - From [facility] in [place] 12/28/2015 Creatinine 1.22, BUN 27, Potassium 4.3, Sodium 141 - From [facility] in [place] 12/09/2015 Creatinine 0.87, BUN 22, Potassium 4.4, Sodium 139 - From [facility] in [place]   Recommendations:  Will review with Dr Lovena Le in the office 04/26/2016.   Patient currently not prescribed diuretic.    Follow-up plan: ICM clinic phone appointment on 05/01/2016 to recheck fluid levels.    ICM trend: 04/24/2016       [name], RN 04/25/2016 9:13 AM

## 2016-04-26 ENCOUNTER — Telehealth: Payer: Self-pay | Admitting: Cardiology

## 2016-04-26 MED ORDER — FUROSEMIDE 20 MG PO TABS: ORAL_TABLET | ORAL | 0 refills | Status: DC

## 2016-04-26 NOTE — Telephone Encounter (Signed)
Pt called and wanted to to [facility] clinic nurse. She wanted to know if nurse could send the prescription for  furosemide (lasix) in today per patient.

## 2016-04-26 NOTE — Progress Notes (Signed)
Reviewed with Dr Lovena Le in office.  Call to sister [name] and explained Dr Lovena Le recommended patient take Furosemide 20 mg 2 tablets (40mg  total) x 3 days only and then stop the medication.  Confirmed she has prescription from May.  She reported she is not sure how much of that bottle is left because her PCP had instructed her to take a some and they used that prescription.   Advised to call back if new prescription is needed and confirmed pharmacy.  Will recheck fluid levels on 05/01/2016.

## 2016-04-26 NOTE — Telephone Encounter (Signed)
See ICM note 

## 2016-04-26 NOTE — Addendum Note (Signed)
Addended by: [name] on: 04/26/2016 03:59 PM   Modules accepted: Orders

## 2016-04-26 NOTE — Progress Notes (Signed)
Sister spoke with Garlon Hatchet CMA stating she needed a refill of Furosemide.   Furosemide 20 mg 2 tablets (40 mg total) x 3 days only.

## 2016-05-01 ENCOUNTER — Telehealth: Payer: Self-pay | Admitting: Internal Medicine

## 2016-05-01 ENCOUNTER — Ambulatory Visit (INDEPENDENT_AMBULATORY_CARE_PROVIDER_SITE_OTHER): Payer: Medicare Other

## 2016-05-01 DIAGNOSIS — Z95810 Presence of automatic (implantable) cardiac defibrillator: Secondary | ICD-10-CM

## 2016-05-01 DIAGNOSIS — I5022 Chronic systolic (congestive) heart failure: Secondary | ICD-10-CM

## 2016-05-01 NOTE — Progress Notes (Signed)
EPIC Encounter for ICM Monitoring  Patient Name: [name] is a 70 y.o. female Date: 05/01/2016 Primary Care Physican: No primary care provider on file. Primary Care Physican: [facility] in [facility] Primary Cardiologist:Dr Raechel Chute ( [place] [phone]) Electrophysiologist: Lovena Le Dry Weight:111 lbs                                          Spoke with sister [name] [facility]). Patient asymptomatic.    Thoracic impedance returned to normal after taking 3 days of Furosemide 20 mg.  Confirmed Furosemide was stopped after 3rd day.    Labs: 01/26/2016 Creatinine 1.14, BUN 23, Potassium 4.2, Sodium 142 - [facility] [place] 12/28/2015 Creatinine 1.22, BUN 27, Potassium 4.3, Sodium 141 - [facility] in [place] 12/09/2015 Creatinine 0.87, BUN 22, Potassium 4.4, Sodium 139 - [facility] in [place]  Recommendations: No changes.  Advised to limit salt intake to 2000 mg daily.  Encouraged to call for fluid symptoms.    Follow-up plan: ICM clinic phone appointment on 05/28/2016.  Copy of ICM check sent to device physician.   ICM trend: 05/01/2016       [name], RN 05/01/2016 3:43 PM

## 2016-05-01 NOTE — Telephone Encounter (Signed)
Pt is calling to speak with Dr.Taylor nurse only states she already know what it is about.

## 2016-05-03 NOTE — Telephone Encounter (Signed)
Spoke with sister, [name]

## 2016-05-21 LAB — CUP PACEART REMOTE DEVICE CHECK
Battery Remaining Longevity: 123 mo
Battery Voltage: 3.01 V
Brady Statistic RV Percent Paced: 0.02 %
HIGH POWER IMPEDANCE MEASURED VALUE: 39 Ohm
Lead Channel Impedance Value: 285 Ohm
Lead Channel Impedance Value: 361 Ohm
Lead Channel Sensing Intrinsic Amplitude: 12.375 mV
Lead Channel Setting Pacing Amplitude: 2.5 V
Lead Channel Setting Pacing Pulse Width: 0.4 ms
Lead Channel Setting Sensing Sensitivity: 0.3 mV
MDC IDC LEAD IMPLANT DT: 20150605
MDC IDC LEAD LOCATION: 753860
MDC IDC MSMT LEADCHNL RV PACING THRESHOLD AMPLITUDE: 0.625 V
MDC IDC MSMT LEADCHNL RV PACING THRESHOLD PULSEWIDTH: 0.4 ms
MDC IDC MSMT LEADCHNL RV SENSING INTR AMPL: 12.375 mV
MDC IDC PG IMPLANT DT: 20150605
MDC IDC SESS DTM: 20171108153727

## 2016-05-28 ENCOUNTER — Ambulatory Visit (INDEPENDENT_AMBULATORY_CARE_PROVIDER_SITE_OTHER): Payer: Medicare Other

## 2016-05-28 ENCOUNTER — Telehealth: Payer: Self-pay

## 2016-05-28 DIAGNOSIS — Z95810 Presence of automatic (implantable) cardiac defibrillator: Secondary | ICD-10-CM | POA: Diagnosis not present

## 2016-05-28 DIAGNOSIS — I5022 Chronic systolic (congestive) heart failure: Secondary | ICD-10-CM | POA: Diagnosis not present

## 2016-05-28 NOTE — Telephone Encounter (Signed)
Remote ICM transmission received.  Attemptedcall to sister and left detailed message regarding transmission and next ICM scheduled for 06/13/2016.  Advised to return call for any fluid symptoms or questions.

## 2016-05-28 NOTE — Progress Notes (Signed)
EPIC Encounter for ICM Monitoring  Patient Name: [name] is a 70 y.o. female Date: 05/28/2016 Primary Care Physican: No primary care provider on file. Primary Care Physican: [facility] in [facility] Primary Cardiologist:Dr Raechel Chute [place] - [phone]) Electrophysiologist: Druscilla Brownie Weight:unknown  Attempted call to sister [name] [name]). Transmission reviewed   Thoracic impedance slightly below base.     Labs: 01/26/2016 Creatinine 1.14, BUN 23, Potassium 4.2, Sodium 142 - [facility] [place] 12/28/2015 Creatinine 1.22, BUN 27, Potassium 4.3, Sodium 141 - [facility] [place] 12/09/2015 Creatinine 0.87, BUN 22, Potassium 4.4, Sodium 139 - [facility] in [place]  Recommendations:  Unable to reach  Follow-up plan: ICM clinic phone appointment on 06/13/2016 to recheck fluid levels.  Copy of ICM check sent to device physician.   ICM trend: 05/28/2016      [name], RN 05/28/2016 5:48 PM

## 2016-06-13 ENCOUNTER — Telehealth: Payer: Self-pay | Admitting: Cardiology

## 2016-06-13 ENCOUNTER — Ambulatory Visit (INDEPENDENT_AMBULATORY_CARE_PROVIDER_SITE_OTHER): Payer: Medicare Other

## 2016-06-13 DIAGNOSIS — Z95810 Presence of automatic (implantable) cardiac defibrillator: Secondary | ICD-10-CM | POA: Diagnosis not present

## 2016-06-13 DIAGNOSIS — I5022 Chronic systolic (congestive) heart failure: Secondary | ICD-10-CM

## 2016-06-13 NOTE — Telephone Encounter (Signed)
Confirmed remote transmission w/ pt sister.   

## 2016-06-14 NOTE — Progress Notes (Signed)
EPIC Encounter for ICM Monitoring  Patient Name: Kniya Riddley is a 70 y.o. female Date: 06/14/2016 Primary Care Physican: No primary care provider on file. Primary Care Physican: [facility] in [facility] Primary Cardiologist:Dr Raechel Chute [place] - [phone]) Electrophysiologist: Druscilla Brownie Weight:unknown      Spoke with sister, [name].  Heart Failure questions reviewed, pt asymptomatic   Thoracic impedance normal   Labs: 01/26/2016 Creatinine 1.14, BUN 23, Potassium 4.2, Sodium 142 - [facility] [place] 12/28/2015 Creatinine 1.22, BUN 27, Potassium 4.3, Sodium 141 - [facility] [place] 12/09/2015 Creatinine 0.87, BUN 22, Potassium 4.4, Sodium 139 - [facility] [place]   Recommendations: No changes.  Reinforced to limit low salt food choices to 2000 mg day and limiting fluid intake to < 2 liters per day. Encouraged to call for fluid symptoms.    Follow-up plan: ICM clinic phone appointment on 07/24/2016.  Copy of ICM check sent to primary cardiologist and device physician.   3 month ICM trend : 06/13/2016   1 Year ICM trend:      [name], RN 06/14/2016 7:56 AM

## 2016-07-24 ENCOUNTER — Ambulatory Visit (INDEPENDENT_AMBULATORY_CARE_PROVIDER_SITE_OTHER): Payer: Medicare Other | Admitting: *Deleted

## 2016-07-24 DIAGNOSIS — I5022 Chronic systolic (congestive) heart failure: Secondary | ICD-10-CM | POA: Diagnosis not present

## 2016-07-24 DIAGNOSIS — Z95810 Presence of automatic (implantable) cardiac defibrillator: Secondary | ICD-10-CM

## 2016-07-24 DIAGNOSIS — I429 Cardiomyopathy, unspecified: Secondary | ICD-10-CM | POA: Diagnosis not present

## 2016-07-24 NOTE — Progress Notes (Signed)
Remote ICD transmission.   

## 2016-07-25 ENCOUNTER — Telehealth: Payer: Self-pay

## 2016-07-25 NOTE — Telephone Encounter (Signed)
Remote ICM transmission received.  Attempted patient call and left detailed message regarding transmission and next ICM scheduled for 08/27/2016.  Advised to return call for any fluid symptoms or questions.

## 2016-07-25 NOTE — Progress Notes (Signed)
EPIC Encounter for ICM Monitoring  Patient Name: [name] is a 71 y.o. female Date: 07/25/2016 Primary Care Physican: No primary care provider on file. Primary Care Physican: [facility] in [facility] Primary Cardiologist:Dr Raechel Chute [place] - [phone]) Electrophysiologist: Druscilla Brownie Weight:unknown      Attempted call to patient and unable to reach.  Left detailed message for sister, [name], regarding transmission.  Transmission reviewed.   Thoracic impedance normal since 07/16/2016.   Labs: 01/26/2016 Creatinine 1.14, BUN 23, Potassium 4.2, Sodium 142 - [facility] [place] [address] 12/28/2015 Creatinine 1.22, BUN 27, Potassium 4.3, Sodium 141 - [facility] [place] 12/09/2015 Creatinine 0.87, BUN 22, Potassium 4.4, Sodium 139 - [facility] [place]  Recommendations: Left voice mail with ICM number and encouraged to call for fluid symptoms.  Follow-up plan: ICM clinic phone appointment on 08/27/2016.  Copy of ICM check sent to evice physician.   3 month ICM trend: 07/24/2016   1 Year ICM trend:      Tiffany Billings, RN 07/25/2016 3:57 PM

## 2016-07-26 ENCOUNTER — Encounter: Payer: Self-pay | Admitting: Cardiology

## 2016-07-26 LAB — CUP PACEART REMOTE DEVICE CHECK
Battery Remaining Longevity: 121 mo
Brady Statistic RV Percent Paced: 0.02 %
HIGH POWER IMPEDANCE MEASURED VALUE: 34 Ohm
Implantable Lead Implant Date: 20150605
Lead Channel Impedance Value: 285 Ohm
Lead Channel Impedance Value: 342 Ohm
Lead Channel Pacing Threshold Amplitude: 0.625 V
Lead Channel Sensing Intrinsic Amplitude: 10 mV
Lead Channel Sensing Intrinsic Amplitude: 10 mV
MDC IDC LEAD LOCATION: 753860
MDC IDC MSMT BATTERY VOLTAGE: 3 V
MDC IDC MSMT LEADCHNL RV PACING THRESHOLD PULSEWIDTH: 0.4 ms
MDC IDC PG IMPLANT DT: 20150605
MDC IDC SESS DTM: 20180207173630
MDC IDC SET LEADCHNL RV PACING AMPLITUDE: 2.5 V
MDC IDC SET LEADCHNL RV PACING PULSEWIDTH: 0.4 ms
MDC IDC SET LEADCHNL RV SENSING SENSITIVITY: 0.3 mV

## 2016-08-05 ENCOUNTER — Telehealth: Payer: Self-pay | Admitting: Internal Medicine

## 2016-08-05 NOTE — Telephone Encounter (Signed)
REVIEWED  RECORDS  DOES NOT  APPEAR  PT  HAD  BEEN  PREVIOUSLY TREATED FOR  DVT  .[name]

## 2016-08-05 NOTE — Telephone Encounter (Signed)
New message       Pt is in the doctors office with a DVT.  They are wanting someone to look at her records and see if she has had this before

## 2016-08-27 ENCOUNTER — Ambulatory Visit (INDEPENDENT_AMBULATORY_CARE_PROVIDER_SITE_OTHER): Payer: Medicare Other

## 2016-08-27 DIAGNOSIS — I5022 Chronic systolic (congestive) heart failure: Secondary | ICD-10-CM | POA: Diagnosis not present

## 2016-08-27 DIAGNOSIS — Z95810 Presence of automatic (implantable) cardiac defibrillator: Secondary | ICD-10-CM | POA: Diagnosis not present

## 2016-08-27 NOTE — Progress Notes (Signed)
EPIC Encounter for ICM Monitoring  Patient Name: [name] is a 71 y.o. female Date: 08/27/2016 Primary Care Physican: [facility] in [place] Primary Cardiologist:Taylor Electrophysiologist: Lovena Le Dry [place]     Call to sister, [name]. Heart Failure questions reviewed, pt asymptomatic.   Thoracic impedance abnormal suggesting fluid accumulation.  Patient no longer visits Dr Darral Dash cardiologist in [place].    No diuretic  Labs: 01/26/2016 Creatinine 1.14, BUN 23, Potassium 4.2, Sodium 142 - [facility] [place] 12/28/2015 Creatinine 1.22, BUN 27, Potassium 4.3, Sodium 141 - [facility] [place] 12/09/2015 Creatinine 0.87, BUN 22, Potassium 4.4, Sodium 139 - [facility] [place]  Recommendations: Copy of ICM check sent to device physician for review and if any recommendations will call her back.    Follow-up plan: ICM clinic phone appointment on 09/12/2016 to recheck fluid levels.   3 month ICM trend: 08/27/2016   1 Year ICM trend:      [name], RN 08/27/2016 5:23 PM

## 2016-09-12 ENCOUNTER — Ambulatory Visit (INDEPENDENT_AMBULATORY_CARE_PROVIDER_SITE_OTHER): Payer: Medicare Other

## 2016-09-12 DIAGNOSIS — Z95810 Presence of automatic (implantable) cardiac defibrillator: Secondary | ICD-10-CM

## 2016-09-12 DIAGNOSIS — I5022 Chronic systolic (congestive) heart failure: Secondary | ICD-10-CM

## 2016-09-13 ENCOUNTER — Telehealth: Payer: Self-pay

## 2016-09-13 NOTE — Addendum Note (Signed)
Addended by: [name] on: 09/13/2016 11:47 AM   Modules accepted: Level of Service

## 2016-09-13 NOTE — Progress Notes (Signed)
EPIC Encounter for ICM Monitoring  Patient Name: [name] is a 71 y.o. female Date: 09/13/2016 Primary Care Physican: [facility] in [place] Primary [place] Electrophysiologist: Lovena Le Dry Weight:unknown      Attempted call to sister [name].  Left detailed message regarding transmission.  Transmission reviewed.    Thoracic impedance abnormal suggesting fluid accumulation but close to baseline.  Fluid index <threshold  No diuretic  Labs: 01/26/2016 Creatinine 1.14, BUN 23, Potassium 4.2, Sodium 142 - [facility] [place] 12/28/2015 Creatinine 1.22, BUN 27, Potassium 4.3, Sodium 141 - [facility] [place] 12/09/2015 Creatinine 0.87, BUN 22, Potassium 4.4, Sodium 139 - [facility] [place]  Recommendations: NONE - Unable to reach patient   Follow-up plan: ICM clinic phone appointment on 10/14/2016.  Copy of ICM check sent to device physician.   3 month ICM trend: 09/12/2016   1 Year ICM trend:      [name], RN 09/13/2016 8:49 AM

## 2016-09-13 NOTE — Telephone Encounter (Signed)
Remote ICM transmission received.  Attempted patient call and left detailed message regarding transmission and next ICM scheduled for 10/14/2016.  Advised to return call for any fluid symptoms or questions.

## 2016-10-14 ENCOUNTER — Ambulatory Visit (INDEPENDENT_AMBULATORY_CARE_PROVIDER_SITE_OTHER): Payer: Medicaid Other

## 2016-10-14 DIAGNOSIS — I5022 Chronic systolic (congestive) heart failure: Secondary | ICD-10-CM

## 2016-10-14 DIAGNOSIS — Z95810 Presence of automatic (implantable) cardiac defibrillator: Secondary | ICD-10-CM

## 2016-10-14 NOTE — Progress Notes (Signed)
EPIC Encounter for ICM Monitoring  Patient Name: [name] is a 71 y.o. female Date: 10/14/2016 Primary Care Physican: No primary care provider on file. Primary Cardiologist:Taylor Electrophysiologist: Lovena Le Dry Weight:unknown      Call to sister [name].  Heart Failure questions reviewed, pt asymptomatic.   Thoracic impedance normal.  No diuretic  Labs: 01/26/2016 Creatinine 1.14, BUN 23, Potassium 4.2, Sodium 142 - [facility] [place] 12/28/2015 Creatinine 1.22, BUN 27, Potassium 4.3, Sodium 141 - [facility] [place] 12/09/2015 Creatinine 0.87, BUN 22, Potassium 4.4, Sodium 139 - [facility] [place]  Recommendations:  No changes. Discussed to limit salt intake to 2000 mg/day and fluid intake to < 2 liters/day.  Encouraged to call for fluid symptoms.  Follow-up plan: ICM clinic phone appointment on 12/06/2016 since she has  defib office appointment scheduled on 11/05/2016 with Dr Lovena Le.  Copy of ICM check sent to device physician.   3 month ICM trend: 10/14/2016   1 Year ICM trend:      [name], RN 10/14/2016 9:11 AM

## 2016-10-21 ENCOUNTER — Encounter: Payer: Self-pay | Admitting: Internal Medicine

## 2016-10-28 IMAGING — NM NM MYOCAR MULTI W/SPECT W/WALL MOTION & EF
3 series · 18 of 18 positions shown · non-contrast
Comparison: none

[Series 1: rest · 8.28mm/px · 6 of 64 frames shown]
[frame 6/64]
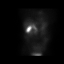
[frame 16/64]
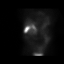
[frame 27/64]
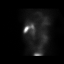
[frame 38/64]
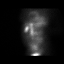
[frame 48/64]
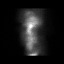
[frame 59/64]
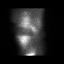

[Series 2: stress gated · 8.28mm/px · 6 of 512 frames shown (1 of 2)]
[frame 43/512]
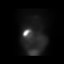
[frame 128/512]
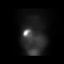
[frame 214/512]
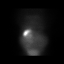
[frame 299/512]
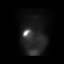
[frame 384/512]
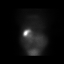
[frame 470/512]
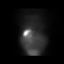

[Series 2: stress gated · 8.28mm/px · 6 of 64 frames shown (2 of 2)]
[frame 6/64]
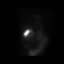
[frame 16/64]
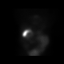
[frame 27/64]
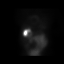
[frame 38/64]
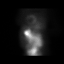
[frame 48/64]
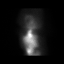
[frame 59/64]
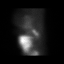

[18 of 18 positions shown; findings below may reference images not displayed]

Canned report from images found in remote index.

Refer to host system for actual result text.

## 2016-11-05 ENCOUNTER — Ambulatory Visit (INDEPENDENT_AMBULATORY_CARE_PROVIDER_SITE_OTHER): Payer: 59 | Admitting: Internal Medicine

## 2016-11-05 ENCOUNTER — Encounter: Payer: Self-pay | Admitting: Internal Medicine

## 2016-11-05 VITALS — BP 112/66 | HR 78 | Ht 60.0 in | Wt 106.2 lb

## 2016-11-05 DIAGNOSIS — I5022 Chronic systolic (congestive) heart failure: Secondary | ICD-10-CM

## 2016-11-05 DIAGNOSIS — Z95810 Presence of automatic (implantable) cardiac defibrillator: Secondary | ICD-10-CM

## 2016-11-05 NOTE — Patient Instructions (Signed)
Medication Instructions:  Your physician has recommended you make the following change in your medication:  STOP Digoxin  Labwork: None Ordered   Testing/Procedures: None Ordered   Follow-Up: Your physician wants you to follow-up in: 1 year with Dr. Lovena Le. You will receive a reminder letter in the mail two months in advance. If you don't receive a letter, please call our office to schedule the follow-up appointment.  Remote monitoring is used to monitor your ICD from home. This monitoring reduces the number of office visits required to check your device to one time per year. It allows [place] to keep an eye on the functioning of your device to ensure it is working properly. You are scheduled for a device check from home on 02/04/17. You may send your transmission at any time that day. If you have a wireless device, the transmission will be sent automatically. After your physician reviews your transmission, you will receive a postcard with your next transmission date.    Any Other Special Instructions Will Be Listed Below (If Applicable).     If you need a refill on your cardiac medications before your next appointment, please call your pharmacy.

## 2016-11-05 NOTE — Progress Notes (Signed)
HPI [name] returns today for followup. She is a very pleasant 71 year old woman with a ischemic cardiomyopathy, chronic class 2 systolic heart failure, status post ICD implantation. She was in the hospital with a swollen arm and placed on anti-coagulation. This does not appear to be in the [facility] system as I have no records.  Allergies  Allergen Reactions  . Beta Adrenergic Blockers Other (See Comments)    fatigue     Current Outpatient Prescriptions  Medication Sig Dispense Refill  . amLODipine (NORVASC) 5 MG tablet Take 5 mg by mouth daily.  6  . carvedilol (COREG) 3.125 MG tablet Take 3.125 mg by mouth 2 (two) times daily.  0  . cetirizine (ZYRTEC) 10 MG tablet Take 10 mg by mouth daily.    . Cholecalciferol (VITAMIN D) 2000 UNITS CAPS Take 1 capsule by mouth daily.      . clopidogrel (PLAVIX) 75 MG tablet Take 75 mg by mouth daily.     Marland Kitchen ELIQUIS 5 MG TABS tablet Take 5 mg by mouth 2 (two) times daily.  3  . ENSURE (ENSURE) Take 237 mLs by mouth daily.     . rosuvastatin (CRESTOR) 20 MG tablet Take 20 mg by mouth daily.    . traMADol (ULTRAM) 50 MG tablet Take 50 mg by mouth every 8 (eight) hours as needed for moderate pain.      No current facility-administered medications for this visit.      Past Medical History:  Diagnosis Date  . 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 Chronic systolic CHF (congestive heart failure), NYHA class 2 ([place])    a. Dx 05/2005;  b. 10/2013 Echo: EF 25-30%, DK and scarring of mid-distal antsept, inf, infsept, & apical myocardium. Severe HK of anterior wall. Gr 1 DD.  Marland Kitchen CVA (cerebral vascular accident) [facility]) 2002, 2008, "mini-stroke"  Oct 2016   Dr. Rob Hickman; "LUE very weak; left leg a little weak but can still walk"   . Deafness in left ear   . Depression   . Dyslipidemia   . Hypertension   . Ischemic cardiomyopathy December 2006   a. 10/2013 Echo: EF 25-30%;  b. 11/2013 MDT  single Lead ICD gen change and lead revision.  Marland Kitchen Uterine fibroid     ROS:   All systems reviewed and negative except as noted in the HPI.   Past Surgical History:  Procedure Laterality Date  . ABDOMINAL HYSTERECTOMY     "partial"  . APPENDECTOMY    . CARDIAC DEFIBRILLATOR PLACEMENT  05/2005; 11/19/2013   MDT ICD implanted 2006 by Dr Lovena Le with [phone] lead; gen change and RV lead revision 11/2013 by Dr Lovena Le  . CHOLECYSTECTOMY    . IMPLANTABLE CARDIOVERTER DEFIBRILLATOR REVISION N/A 11/19/2013   Procedure: IMPLANTABLE CARDIOVERTER DEFIBRILLATOR REVISION;  Surgeon: Evans Lance, MD;  Location: [facility] CATH LAB;  Service: Cardiovascular;  Laterality: N/A;  . TONSILLECTOMY       Family History  Problem Relation Age of Onset  . Heart disease Mother 74       Cerebrovascular Accident  . Mental retardation Sister      Social History   Social History  . Marital status: Legally Separated    Spouse name: N/A  . Number of children: N/A  . Years of education: N/A   Occupational History  . disabled    Social History Main Topics  . Smoking status: Former Smoker    Packs/day:  0.10    Years: 42.00    Types: Cigarettes    Quit date: 06/18/2007  . Smokeless tobacco: Never Used  . Alcohol use No     Comment: "stopped drinking alcohol in the 1990's"  . Drug use: No     Comment: "stopped using drugs "before 2009"  . Sexual activity: No   Other Topics Concern  . Not on file   Social History Narrative  . No narrative on file     BP 112/66   Pulse 78   Ht 5' (1.524 m)   Wt 106 lb 3.2 oz (48.2 kg)   SpO2 96%   BMI 20.74 kg/m   Physical Exam:  stable appearing middle-age woman,NAD HEENT: Unremarkable except for poor dentition. Neck:  6 cm JVD, no thyromegally Back:  No CVA tenderness Lungs:  Clear with no wheezes, rales, or rhonchi. Well healed ICD incision. HEART:  Regular rate rhythm, no murmurs, no rubs, no clicks Abd:  soft, positive bowel sounds, no organomegally, no  rebound, no guarding Ext:  2 plus pulses, no edema, no cyanosis, no clubbing Skin:  No rashes no nodules Neuro:  CN II through XII intact, motor grossly intact  DEVICE  Normal device function.  See PaceArt for details.   Assess/Plan: 1. Chronic systolic heart failure - Her fluid index is under better control. 2. HTN - her blood pressure is well controlled. Will follow. 3. ICD - her Medtronic device is working normally. Will follow. 4. CAD - she is s/p MI remotely. She denies anginal symptoms.   Mikle Bosworth.D.

## 2016-12-06 ENCOUNTER — Telehealth: Payer: Self-pay | Admitting: Cardiology

## 2016-12-06 ENCOUNTER — Ambulatory Visit (INDEPENDENT_AMBULATORY_CARE_PROVIDER_SITE_OTHER): Payer: 59

## 2016-12-06 DIAGNOSIS — Z95810 Presence of automatic (implantable) cardiac defibrillator: Secondary | ICD-10-CM | POA: Diagnosis not present

## 2016-12-06 DIAGNOSIS — I5022 Chronic systolic (congestive) heart failure: Secondary | ICD-10-CM

## 2016-12-06 NOTE — Progress Notes (Signed)
EPIC Encounter for ICM Monitoring  Patient Name: [name] is a 71 y.o. female Date: 12/06/2016 Primary Care Physican: Patient, No Pcp Per Primary Cardiologist:Taylor Electrophysiologist: Lovena Le Dry Weight:unknown                                             Call to sister [name].  Heart Failure questions reviewed, pt asymptomatic.   Thoracic impedance normal.  No diuretic  Labs: 01/26/2016 Creatinine 1.14, BUN 23, Potassium 4.2, Sodium 142 - [facility] [place] 12/28/2015 Creatinine 1.22, BUN 27, Potassium 4.3, Sodium 141 - [facility] [place] 12/09/2015 Creatinine 0.87, BUN 22, Potassium 4.4, Sodium 139 - [facility] [place]  Recommendations: No changes.  Advised to limit salt intake to 2000 mg/day and fluid intake to < 2 liters/day.  Encouraged to call for fluid symptoms.  Follow-up plan: ICM clinic phone appointment on 01/07/2017.    Copy of ICM check sent to device physician.   3 month ICM trend: 12/06/2016   1 Year ICM trend:      [name], RN 12/06/2016 1:29 PM

## 2016-12-06 NOTE — Telephone Encounter (Signed)
Confirmed remote transmission w/ pt sister.   

## 2017-01-07 ENCOUNTER — Ambulatory Visit (INDEPENDENT_AMBULATORY_CARE_PROVIDER_SITE_OTHER): Payer: 59

## 2017-01-07 DIAGNOSIS — Z95810 Presence of automatic (implantable) cardiac defibrillator: Secondary | ICD-10-CM | POA: Diagnosis not present

## 2017-01-07 DIAGNOSIS — I5022 Chronic systolic (congestive) heart failure: Secondary | ICD-10-CM

## 2017-01-07 NOTE — Progress Notes (Signed)
EPIC Encounter for ICM Monitoring  Patient Name: [name] is a 71 y.o. female Date: 01/07/2017 Primary Care Physican: Patient, No Pcp Per Primary Cardiologist:Taylor Electrophysiologist: Lovena Le Dry Weight:unknown     Call to sister [name].  Heart Failure questions reviewed, pt asymptomatic.  She said patient is doing well.    Thoracic impedance normal.  No diuretic  Labs: 01/26/2016 Creatinine 1.14, BUN 23, Potassium 4.2, Sodium 142 - [facility] [place] 12/28/2015 Creatinine 1.22, BUN 27, Potassium 4.3, Sodium 141 - [facility] [place] 12/09/2015 Creatinine 0.87, BUN 22, Potassium 4.4, Sodium 139 - [facility] [place]  Recommendations: No changes.  Encouraged to call for fluid symptoms.  Follow-up plan: ICM clinic phone appointment on 02/10/2017.    Copy of ICM check sent to device physician.   3 month ICM trend: 01/07/2017   1 Year ICM trend:      [name], RN 01/07/2017 11:30 AM

## 2017-02-10 ENCOUNTER — Ambulatory Visit (INDEPENDENT_AMBULATORY_CARE_PROVIDER_SITE_OTHER): Payer: 59 | Admitting: *Deleted

## 2017-02-10 DIAGNOSIS — Z95810 Presence of automatic (implantable) cardiac defibrillator: Secondary | ICD-10-CM

## 2017-02-10 DIAGNOSIS — I429 Cardiomyopathy, unspecified: Secondary | ICD-10-CM

## 2017-02-10 DIAGNOSIS — I5022 Chronic systolic (congestive) heart failure: Secondary | ICD-10-CM

## 2017-02-10 NOTE — Progress Notes (Signed)
EPIC Encounter for ICM Monitoring  Patient Name: [name] is a 71 y.o. female Date: 02/10/2017 Primary Care Physican: Patient, No Pcp Per Primary Cardiologist:[name] Electrophysiologist: [name] Dry Weight:unknown       Spoke with sister [name]. Heart Failure questions reviewed, pt asymptomatic.   Thoracic impedance normal.  Prescribed dosage: No diuretic  Labs: 01/26/2016 Creatinine 1.14, BUN 23, Potassium 4.2, Sodium 142 - [facility] [place] 12/28/2015 Creatinine 1.22, BUN 27, Potassium 4.3, Sodium 141 - [facility] [place] 12/09/2015 Creatinine 0.87, BUN 22, Potassium 4.4, Sodium 139 - [facility] [place]  Recommendations: No changes.   Encouraged to call for fluid symptoms.  Follow-up plan: ICM clinic phone appointment on 03/20/2017.    Copy of ICM check sent to Dr. [name].   3 month ICM trend: 02/10/2017   1 Year ICM trend:      Tiffany Billings, RN 02/10/2017 4:32 PM

## 2017-02-10 NOTE — Progress Notes (Signed)
Remote ICD transmission.   

## 2017-02-11 LAB — CUP PACEART REMOTE DEVICE CHECK
Battery Remaining Longevity: 113 mo
Battery Voltage: 2.93 V
Brady Statistic RV Percent Paced: 0 %
HIGH POWER IMPEDANCE MEASURED VALUE: 44 Ohm
Implantable Lead Location: 753860
Implantable Pulse Generator Implant Date: 20150605
Lead Channel Pacing Threshold Amplitude: 0.75 V
Lead Channel Sensing Intrinsic Amplitude: 9.25 mV
Lead Channel Setting Pacing Amplitude: 2.5 V
Lead Channel Setting Pacing Pulse Width: 0.4 ms
MDC IDC LEAD IMPLANT DT: 20150605
MDC IDC MSMT LEADCHNL RV IMPEDANCE VALUE: 285 Ohm
MDC IDC MSMT LEADCHNL RV IMPEDANCE VALUE: 342 Ohm
MDC IDC MSMT LEADCHNL RV PACING THRESHOLD PULSEWIDTH: 0.4 ms
MDC IDC MSMT LEADCHNL RV SENSING INTR AMPL: 9.25 mV
MDC IDC SESS DTM: 20180827172513
MDC IDC SET LEADCHNL RV SENSING SENSITIVITY: 0.3 mV

## 2017-02-21 ENCOUNTER — Encounter: Payer: Self-pay | Admitting: Cardiology

## 2017-03-20 ENCOUNTER — Telehealth: Payer: Self-pay | Admitting: Cardiology

## 2017-03-20 ENCOUNTER — Ambulatory Visit (INDEPENDENT_AMBULATORY_CARE_PROVIDER_SITE_OTHER): Payer: 59

## 2017-03-20 DIAGNOSIS — Z95810 Presence of automatic (implantable) cardiac defibrillator: Secondary | ICD-10-CM | POA: Diagnosis not present

## 2017-03-20 DIAGNOSIS — I5022 Chronic systolic (congestive) heart failure: Secondary | ICD-10-CM

## 2017-03-20 NOTE — Telephone Encounter (Signed)
Confirmed remote transmission w/ pt sister.   

## 2017-03-21 NOTE — Progress Notes (Signed)
EPIC Encounter for ICM Monitoring  Patient Name: [name] is a 71 y.o. female Date: 03/21/2017 Primary Care Physican: Patient, No Pcp Per Primary Cardiologist:Taylor Electrophysiologist: Lovena Le Dry Weight:unknown      Spoke with sister [name].  Heart Failure questions reviewed, pt asymptomatic.   Thoracic impedance normal.  No Diuretic  Recommendations: No changes.   Encouraged to call for fluid symptoms.  Follow-up plan: ICM clinic phone appointment on 04/22/2017.   Copy of ICM check sent to Dr. Lovena Le.   3 month ICM trend: 03/21/2017   1 Year ICM trend:      [name], RN 03/21/2017 8:00 AM

## 2017-04-22 ENCOUNTER — Telehealth: Payer: Self-pay | Admitting: Cardiology

## 2017-04-22 ENCOUNTER — Ambulatory Visit (INDEPENDENT_AMBULATORY_CARE_PROVIDER_SITE_OTHER): Payer: 59

## 2017-04-22 DIAGNOSIS — I5022 Chronic systolic (congestive) heart failure: Secondary | ICD-10-CM

## 2017-04-22 DIAGNOSIS — Z95810 Presence of automatic (implantable) cardiac defibrillator: Secondary | ICD-10-CM

## 2017-04-22 NOTE — Telephone Encounter (Signed)
LMOVM reminding pt to send remote transmission.   

## 2017-04-25 NOTE — Progress Notes (Signed)
EPIC Encounter for ICM Monitoring  Patient Name: [name] is a 71 y.o. female Date: 04/25/2017 Primary Care Physican: Patient, No Pcp Per Primary Cardiologist:Taylor Electrophysiologist: Lovena Le Dry Weight:unknown                                           Spoke with sister [name].  Heart Failure questions reviewed, pt asymptomatic.   Thoracic impedance normal.  No Diuretic  Recommendations: No changes.   Encouraged to call for fluid symptoms.  Follow-up plan: ICM clinic phone appointment on 05/27/2017.    Copy of ICM check sent to Dr. Lovena Le.   3 month ICM trend: 04/23/2017    1 Year ICM trend:       Tiffany Billings, RN 04/25/2017 11:05 AM

## 2017-05-27 ENCOUNTER — Ambulatory Visit (INDEPENDENT_AMBULATORY_CARE_PROVIDER_SITE_OTHER): Payer: 59 | Admitting: *Deleted

## 2017-05-27 DIAGNOSIS — I429 Cardiomyopathy, unspecified: Secondary | ICD-10-CM | POA: Diagnosis not present

## 2017-05-27 DIAGNOSIS — I5022 Chronic systolic (congestive) heart failure: Secondary | ICD-10-CM

## 2017-05-27 DIAGNOSIS — Z95810 Presence of automatic (implantable) cardiac defibrillator: Secondary | ICD-10-CM | POA: Diagnosis not present

## 2017-05-28 NOTE — Progress Notes (Signed)
Remote ICD transmission.   

## 2017-05-29 LAB — CUP PACEART REMOTE DEVICE CHECK
Battery Voltage: 2.99 V
Brady Statistic RV Percent Paced: 0.01 %
Date Time Interrogation Session: 20181211222712
HighPow Impedance: 48 Ohm
Implantable Lead Location: 753860
Lead Channel Impedance Value: 361 Ohm
Lead Channel Sensing Intrinsic Amplitude: 9.375 mV
Lead Channel Sensing Intrinsic Amplitude: 9.375 mV
Lead Channel Setting Pacing Amplitude: 2.5 V
MDC IDC LEAD IMPLANT DT: 20150605
MDC IDC MSMT BATTERY REMAINING LONGEVITY: 105 mo
MDC IDC MSMT LEADCHNL RV IMPEDANCE VALUE: 304 Ohm
MDC IDC MSMT LEADCHNL RV PACING THRESHOLD AMPLITUDE: 0.625 V
MDC IDC MSMT LEADCHNL RV PACING THRESHOLD PULSEWIDTH: 0.4 ms
MDC IDC PG IMPLANT DT: 20150605
MDC IDC SET LEADCHNL RV PACING PULSEWIDTH: 0.4 ms
MDC IDC SET LEADCHNL RV SENSING SENSITIVITY: 0.3 mV

## 2017-05-30 ENCOUNTER — Encounter: Payer: Self-pay | Admitting: Cardiology

## 2017-06-02 NOTE — Progress Notes (Signed)
EPIC Encounter for ICM Monitoring  Patient Name: [name] is a 71 y.o. female Date: 06/02/2017 Primary Care Physican: Patient, No Pcp Per Primary Cardiologist:Taylor Electrophysiologist: Lovena Le Dry Weight:unknown                                           Transmission reviewed   Thoracic impedance normal.  No Diuretic  Recommendations: No changes.     Follow-up plan: ICM clinic phone appointment on 06/30/2017.    Copy of ICM check sent to Dr. Lovena Le.   3 month ICM trend: 05/27/2017    1 Year ICM trend:       [name], RN 06/02/2017 2:29 PM

## 2017-06-30 ENCOUNTER — Ambulatory Visit (INDEPENDENT_AMBULATORY_CARE_PROVIDER_SITE_OTHER): Payer: 59

## 2017-06-30 DIAGNOSIS — I5022 Chronic systolic (congestive) heart failure: Secondary | ICD-10-CM

## 2017-06-30 DIAGNOSIS — Z95810 Presence of automatic (implantable) cardiac defibrillator: Secondary | ICD-10-CM | POA: Diagnosis not present

## 2017-07-01 NOTE — Progress Notes (Signed)
EPIC Encounter for ICM Monitoring  Patient Name: [name] is a 72 y.o. female Date: 07/01/2017 Primary Care Physican: Patient, No Pcp Per Primary Cardiologist:Taylor Electrophysiologist: Lovena Le Dry Weight:unknown      Spoke with sister [name].Heart Failure questions reviewed, pt asymptomatic.   Thoracic impedance normal.  No Diuretic  Recommendations: No changes.   Encouraged to call for fluid symptoms.  Follow-up plan: ICM clinic phone appointment on 07/31/2017.    Copy of ICM check sent to Dr. Lovena Le.   3 month ICM trend: 06/30/2017    1 Year ICM trend:       [name], RN 07/01/2017 11:31 AM

## 2017-07-31 ENCOUNTER — Ambulatory Visit (INDEPENDENT_AMBULATORY_CARE_PROVIDER_SITE_OTHER): Payer: 59

## 2017-07-31 ENCOUNTER — Telehealth: Payer: Self-pay

## 2017-07-31 DIAGNOSIS — I5022 Chronic systolic (congestive) heart failure: Secondary | ICD-10-CM

## 2017-07-31 DIAGNOSIS — Z95810 Presence of automatic (implantable) cardiac defibrillator: Secondary | ICD-10-CM

## 2017-07-31 NOTE — Progress Notes (Signed)
EPIC Encounter for ICM Monitoring  Patient Name: [name] is a 72 y.o. female Date: 07/31/2017 Primary Care Physican: Patient, No Pcp Per Primary Cardiologist:Taylor Electrophysiologist: Lovena Le Dry Weight:unknown                                            Attempted to call sister [name].  Left detailed message regarding transmission.  Transmission reviewed.    Thoracic impedance normal.  No Diuretic  Recommendations: Left voice mail with ICM number and encouraged to call if experiencing any fluid symptoms.  Follow-up plan: ICM clinic phone appointment on 09/01/2017.    Copy of ICM check sent to Dr. Lovena Le.   3 month ICM trend: 07/31/2017    1 Year ICM trend:       [name], RN 07/31/2017 11:25 AM

## 2017-07-31 NOTE — Telephone Encounter (Signed)
Remote ICM transmission received.  Attempted call to sister [name] and left detailed message per [facility] regarding transmission and next ICM scheduled for 09/01/2017.  Advised to return call for any fluid symptoms or questions.

## 2017-08-26 ENCOUNTER — Ambulatory Visit (INDEPENDENT_AMBULATORY_CARE_PROVIDER_SITE_OTHER): Payer: 59 | Admitting: *Deleted

## 2017-08-26 DIAGNOSIS — I429 Cardiomyopathy, unspecified: Secondary | ICD-10-CM | POA: Diagnosis not present

## 2017-08-26 NOTE — Progress Notes (Signed)
Remote ICD transmission.   

## 2017-08-27 ENCOUNTER — Encounter: Payer: Self-pay | Admitting: Cardiology

## 2017-09-01 ENCOUNTER — Ambulatory Visit (INDEPENDENT_AMBULATORY_CARE_PROVIDER_SITE_OTHER): Payer: 59

## 2017-09-01 DIAGNOSIS — I5022 Chronic systolic (congestive) heart failure: Secondary | ICD-10-CM | POA: Diagnosis not present

## 2017-09-01 DIAGNOSIS — Z95810 Presence of automatic (implantable) cardiac defibrillator: Secondary | ICD-10-CM

## 2017-09-01 NOTE — Progress Notes (Signed)
EPIC Encounter for ICM Monitoring  Patient Name: [name] is a 72 y.o. female Date: 09/01/2017 Primary Care Physican: Patient, No Pcp Per Primary Cardiologist:Taylor Electrophysiologist: Lovena Le Dry Weight:unknown      Spoke with sister, [name].  Heart Failure questions reviewed, pt asymptomatic.   Thoracic impedance slightly abnormal suggesting fluid accumulation.  No Diuretic  Recommendations: Advised to decrease salt intake.  Encouraged to call for fluid symptoms.  Follow-up plan: ICM clinic phone appointment on 09/08/2017.    Copy of ICM check sent to Dr. Lovena Le.   3 month ICM trend: 09/01/2017    1 Year ICM trend:       [name], RN 09/01/2017 2:18 PM

## 2017-09-03 LAB — CUP PACEART REMOTE DEVICE CHECK
Brady Statistic RV Percent Paced: 0.01 %
Date Time Interrogation Session: 20190312113626
HighPow Impedance: 41 Ohm
Implantable Lead Location: 753860
Implantable Pulse Generator Implant Date: 20150605
Lead Channel Impedance Value: 342 Ohm
Lead Channel Pacing Threshold Pulse Width: 0.4 ms
Lead Channel Sensing Intrinsic Amplitude: 9.125 mV
Lead Channel Sensing Intrinsic Amplitude: 9.125 mV
MDC IDC LEAD IMPLANT DT: 20150605
MDC IDC MSMT BATTERY REMAINING LONGEVITY: 104 mo
MDC IDC MSMT BATTERY VOLTAGE: 3 V
MDC IDC MSMT LEADCHNL RV IMPEDANCE VALUE: 304 Ohm
MDC IDC MSMT LEADCHNL RV PACING THRESHOLD AMPLITUDE: 0.625 V
MDC IDC SET LEADCHNL RV PACING AMPLITUDE: 2.5 V
MDC IDC SET LEADCHNL RV PACING PULSEWIDTH: 0.4 ms
MDC IDC SET LEADCHNL RV SENSING SENSITIVITY: 0.3 mV

## 2017-09-08 ENCOUNTER — Telehealth: Payer: Self-pay | Admitting: Cardiology

## 2017-09-08 ENCOUNTER — Ambulatory Visit (INDEPENDENT_AMBULATORY_CARE_PROVIDER_SITE_OTHER): Payer: Self-pay

## 2017-09-08 DIAGNOSIS — Z95810 Presence of automatic (implantable) cardiac defibrillator: Secondary | ICD-10-CM

## 2017-09-08 DIAGNOSIS — I5022 Chronic systolic (congestive) heart failure: Secondary | ICD-10-CM

## 2017-09-08 NOTE — Telephone Encounter (Signed)
Spoke with pt and reminded pt of remote transmission that is due today. Pt verbalized understanding.   

## 2017-09-09 NOTE — Progress Notes (Signed)
EPIC Encounter for ICM Monitoring  Patient Name: [name] is a 72 y.o. female Date: 09/09/2017 Primary Care Physican: Patient, No Pcp Per Primary Cardiologist:Taylor Electrophysiologist: Lovena Le Dry Weight:unknown        Spoke with sister, [name]. Heart Failure questions reviewed, pt asymptomatic.   Thoracic impedance normal.  No diuretic  Recommendations: No changes.  Encouraged to call for fluid symptoms.  Follow-up plan: ICM clinic phone appointment on 10/02/2017.  Office appointment scheduled 11/25/2017 with Dr. Lovena Le.  Copy of ICM check sent to Dr. Lovena Le.   3 month ICM trend: 09/08/2017    1 Year ICM trend:       [name], RN 09/09/2017 12:14 PM

## 2017-10-02 ENCOUNTER — Ambulatory Visit (INDEPENDENT_AMBULATORY_CARE_PROVIDER_SITE_OTHER): Payer: 59

## 2017-10-02 DIAGNOSIS — Z95810 Presence of automatic (implantable) cardiac defibrillator: Secondary | ICD-10-CM | POA: Diagnosis not present

## 2017-10-02 DIAGNOSIS — I5022 Chronic systolic (congestive) heart failure: Secondary | ICD-10-CM

## 2017-10-03 NOTE — Progress Notes (Signed)
EPIC Encounter for ICM Monitoring  Patient Name: [name] is a 72 y.o. female Date: 10/03/2017 Primary Care Physican: Patient, No Pcp Per Primary Cardiologist:[name] Electrophysiologist: [name] Dry Weight:unknown       Spoke with sister, [name].Heart  Failure questions reviewed, pt asymptomatic.   Thoracic impedance normal.  No diuretic  Recommendations: No changes.   Encouraged to call for fluid symptoms.  Follow-up plan: ICM clinic phone appointment on 11/03/2017.  Office appointment scheduled 11/25/2017 with Dr. [name].  Copy of ICM check sent to Dr. [name].   3 month ICM trend: 10/02/2017    1 Year ICM trend:       [name], RN 10/03/2017 8:52 AM

## 2017-11-03 ENCOUNTER — Ambulatory Visit (INDEPENDENT_AMBULATORY_CARE_PROVIDER_SITE_OTHER): Payer: 59

## 2017-11-03 DIAGNOSIS — Z95810 Presence of automatic (implantable) cardiac defibrillator: Secondary | ICD-10-CM

## 2017-11-03 DIAGNOSIS — I5022 Chronic systolic (congestive) heart failure: Secondary | ICD-10-CM

## 2017-11-03 NOTE — Progress Notes (Signed)
EPIC Encounter for ICM Monitoring  Patient Name: [name] is a 72 y.o. female Date: 11/03/2017 Primary Care Physican: Patient, No Pcp Per Primary Cardiologist:Taylor Electrophysiologist: Lovena Le Dry Weight:unknown      Attempted call to sister [name] and unable to reach.  Left detailed message, per DPR, regarding transmission.  Transmission reviewed.    Thoracic impedance normal.  Nodiuretic  Recommendations: Left voice mail with ICM number and encouraged to call if experiencing any fluid symptoms.  Follow-up plan: ICM clinic phone appointment on 12/29/2017.  Office appointment scheduled 11/25/2017 with Dr. Lovena Le.  Copy of ICM check sent to Dr. Lovena Le.   3 month ICM trend: 11/03/2017    1 Year ICM trend:       Tiffany Billings, RN 11/03/2017 11:28 AM

## 2017-11-06 ENCOUNTER — Telehealth: Payer: Self-pay

## 2017-11-06 NOTE — Telephone Encounter (Signed)
Remote ICM transmission received.  Attempted call to sister, Jeanne Ivan and left detailed message, per DPR, regarding transmission and next ICM scheduled for 12/29/2017.  Advised to return call for any fluid symptoms or questions.

## 2017-11-25 ENCOUNTER — Encounter: Payer: Self-pay | Admitting: Internal Medicine

## 2017-11-25 ENCOUNTER — Ambulatory Visit (INDEPENDENT_AMBULATORY_CARE_PROVIDER_SITE_OTHER): Payer: 59 | Admitting: Internal Medicine

## 2017-11-25 ENCOUNTER — Encounter (INDEPENDENT_AMBULATORY_CARE_PROVIDER_SITE_OTHER): Payer: Self-pay

## 2017-11-25 VITALS — BP 102/68 | HR 73 | Ht 60.0 in | Wt 107.0 lb

## 2017-11-25 DIAGNOSIS — Z95810 Presence of automatic (implantable) cardiac defibrillator: Secondary | ICD-10-CM

## 2017-11-25 DIAGNOSIS — I10 Essential (primary) hypertension: Secondary | ICD-10-CM | POA: Diagnosis not present

## 2017-11-25 DIAGNOSIS — I5022 Chronic systolic (congestive) heart failure: Secondary | ICD-10-CM

## 2017-11-25 NOTE — Progress Notes (Addendum)
HPI [name] returns today for followup. She is a very pleasant 72 year old woman with a ischemic cardiomyopathy, chronic class 2 systolic heart failure, status post ICD implantation. She was in the hospital with a swollen arm and placed on anti-coagulation. She has improved and her blood thinners were stopped. She denies chest pain or sob. No syncope.   Allergies  Allergen Reactions  . Beta Adrenergic Blockers Other (See Comments)    fatigue     Current Outpatient Medications  Medication Sig Dispense Refill  . amLODipine (NORVASC) 5 MG tablet Take 5 mg by mouth daily.  6  . carvedilol (COREG) 3.125 MG tablet Take 3.125 mg by mouth 2 (two) times daily.  0  . cetirizine (ZYRTEC) 10 MG tablet Take 10 mg by mouth daily.    . Cholecalciferol (VITAMIN D) 2000 UNITS CAPS Take 1 capsule by mouth daily.      . clopidogrel (PLAVIX) 75 MG tablet Take 75 mg by mouth daily.     Marland Kitchen ELIQUIS 5 MG TABS tablet Take 5 mg by mouth 2 (two) times daily.  3  . ENSURE (ENSURE) Take 237 mLs by mouth daily.     . rosuvastatin (CRESTOR) 20 MG tablet Take 20 mg by mouth daily.    Marland Kitchen tetrahydrozoline 0.05 % ophthalmic solution Place 1 drop into both eyes 2 (two) times daily.    . traMADol (ULTRAM) 50 MG tablet Take 50 mg by mouth every 8 (eight) hours as needed for moderate pain.      No current facility-administered medications for this visit.      Past Medical History:  Diagnosis Date  . 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 Chronic systolic CHF (congestive heart failure), NYHA class 2 ([place])    a. Dx 05/2005;  b. 10/2013 Echo: EF 25-30%, DK and scarring of mid-distal antsept, inf, infsept, & apical myocardium. Severe HK of anterior wall. Gr 1 DD.  Marland Kitchen CVA (cerebral vascular accident) [facility]) 2002, 2008, "mini-stroke"  Oct 2016   Dr. Rob Hickman; "LUE very weak; left leg a little weak but can still walk"   . Deafness in left ear   .  Depression   . Dyslipidemia   . Hypertension   . Ischemic cardiomyopathy December 2006   a. 10/2013 Echo: EF 25-30%;  b. 11/2013 MDT single Lead ICD gen change and lead revision.  Marland Kitchen Uterine fibroid     ROS:   All systems reviewed and negative except as noted in the HPI.   Past Surgical History:  Procedure Laterality Date  . ABDOMINAL HYSTERECTOMY     "partial"  . APPENDECTOMY    . CARDIAC DEFIBRILLATOR PLACEMENT  05/2005; 11/19/2013   MDT ICD implanted 2006 by Dr Lovena Le with [phone] lead; gen change and RV lead revision 11/2013 by Dr Lovena Le  . CHOLECYSTECTOMY    . IMPLANTABLE CARDIOVERTER DEFIBRILLATOR REVISION N/A 11/19/2013   Procedure: IMPLANTABLE CARDIOVERTER DEFIBRILLATOR REVISION;  Surgeon: Evans Lance, MD;  Location: [facility] CATH LAB;  Service: Cardiovascular;  Laterality: N/A;  . TONSILLECTOMY       Family History  Problem Relation Age of Onset  . Heart disease Mother 27       Cerebrovascular Accident  . Mental retardation Sister      Social History   Socioeconomic History  . Marital status: Legally Separated    Spouse name: Not on file  . Number of children: Not on file  .  Years of education: Not on file  . Highest education level: Not on file  Occupational History  . Occupation: disabled  Social Needs  . Financial resource strain: Not on file  . Food insecurity:    Worry: Not on file    Inability: Not on file  . Transportation needs:    Medical: Not on file    Non-medical: Not on file  Tobacco Use  . Smoking status: Former Smoker    Packs/day: 0.10    Years: 42.00    Pack years: 4.20    Types: Cigarettes    Last attempt to quit: 06/18/2007    Years since quitting: 10.4  . Smokeless tobacco: Never Used  Substance and Sexual Activity  . Alcohol use: No    Alcohol/week: 0.0 oz    Comment: "stopped drinking alcohol in the 1990's"  . Drug use: No    Types: Cocaine    Comment: "stopped using drugs "before 2009"  . Sexual activity: Never  Lifestyle  .  Physical activity:    Days per week: Not on file    Minutes per session: Not on file  . Stress: Not on file  Relationships  . Social connections:    Talks on phone: Not on file    Gets together: Not on file    Attends religious service: Not on file    Active member of club or organization: Not on file    Attends meetings of clubs or organizations: Not on file    Relationship status: Not on file  . Intimate partner violence:    Fear of current or ex partner: Not on file    Emotionally abused: Not on file    Physically abused: Not on file    Forced sexual activity: Not on file  Other Topics Concern  . Not on file  Social History Narrative  . Not on file     BP 102/68   Pulse 73   Ht 5' (1.524 m)   Wt 107 lb (48.5 kg)   SpO2 96%   BMI 20.90 kg/m   Physical Exam:  Well appearing 72 yo woman, NAD HEENT: Unremarkable Neck:  No JVD, no thyromegally Lymphatics:  No adenopathy Back:  No CVA tenderness Lungs:  Clear with no wheezes HEART:  Regular rate rhythm, no murmurs, no rubs, no clicks Abd:  soft, positive bowel sounds, no organomegally, no rebound, no guarding Ext:  2 plus pulses, no edema, no cyanosis, no clubbing Skin:  No rashes no nodules Neuro:  CN II through XII intact, motor grossly intact  EKG - NSR with old anterior MI  DEVICE  Normal device function.  See PaceArt for details.   Assess/Plan: 1. ICM - she denies anginal symptoms. She will continue her current meds.  2. ICD - her Medtronic device is working normally. She has about 8 years of battery longevity 3. HTN - her blood pressure is well controlled.   Mikle Bosworth.D.

## 2017-11-25 NOTE — Patient Instructions (Signed)
Medication Instructions:  Your physician recommends that you continue on your current medications as directed. Please refer to the Current Medication list given to you today.  Labwork: None ordered.  Testing/Procedures: None ordered.  Follow-Up: Your physician wants you to follow-up in: one year with Dr. Lovena Le.   You will receive a reminder letter in the mail two months in advance. If you don't receive a letter, please call our office to schedule the follow-up appointment.  Remote monitoring is used to monitor your ICD from home. This monitoring reduces the number of office visits required to check your device to one time per year. It allows [place] to keep an eye on the functioning of your device to ensure it is working properly. You are scheduled for a device check from home on 12/29/2017. You may send your transmission at any time that day. If you have a wireless device, the transmission will be sent automatically. After your physician reviews your transmission, you will receive a postcard with your next transmission date.  Any Other Special Instructions Will Be Listed Below (If Applicable).  If you need a refill on your cardiac medications before your next appointment, please call your pharmacy.

## 2017-12-29 ENCOUNTER — Ambulatory Visit (INDEPENDENT_AMBULATORY_CARE_PROVIDER_SITE_OTHER): Payer: 59 | Admitting: *Deleted

## 2017-12-29 ENCOUNTER — Ambulatory Visit (INDEPENDENT_AMBULATORY_CARE_PROVIDER_SITE_OTHER): Payer: 59

## 2017-12-29 DIAGNOSIS — I429 Cardiomyopathy, unspecified: Secondary | ICD-10-CM

## 2017-12-29 DIAGNOSIS — Z95810 Presence of automatic (implantable) cardiac defibrillator: Secondary | ICD-10-CM | POA: Diagnosis not present

## 2017-12-29 DIAGNOSIS — I5022 Chronic systolic (congestive) heart failure: Secondary | ICD-10-CM | POA: Diagnosis not present

## 2017-12-29 NOTE — Progress Notes (Signed)
EPIC Encounter for ICM Monitoring  Patient Name: [name] is a 72 y.o. female Date: 12/29/2017 Primary Care Physican: Patient, No Pcp Per Primary Cardiologist:Taylor Electrophysiologist: Druscilla Brownie Weight:107 lbs(per 6/11 office visit)                                                          Attempted call to sister [name] and unable to reach.  Left detailed message regarding report.  Transmission reviewed.   Thoracic impedance normal.  Nodiuretic  Recommendations:   No changes.    Encouraged to call for fluid symptoms.  Follow-up plan: ICM clinic phone appointment on 01/29/2018.    Copy of ICM check sent to Dr. Lovena Le.   3 month ICM trend: 12/29/2017    1 Year ICM trend:       [name], RN 12/29/2017 2:49 PM

## 2017-12-29 NOTE — Progress Notes (Signed)
Remote ICD transmission.   

## 2017-12-30 ENCOUNTER — Telehealth: Payer: Self-pay

## 2017-12-30 LAB — CUP PACEART REMOTE DEVICE CHECK
Battery Voltage: 2.99 V
Brady Statistic RV Percent Paced: 0.01 %
HIGH POWER IMPEDANCE MEASURED VALUE: 38 Ohm
Implantable Lead Implant Date: 20150605
Lead Channel Impedance Value: 285 Ohm
Lead Channel Impedance Value: 342 Ohm
Lead Channel Setting Pacing Amplitude: 2.5 V
Lead Channel Setting Pacing Pulse Width: 0.4 ms
Lead Channel Setting Sensing Sensitivity: 0.3 mV
MDC IDC LEAD LOCATION: 753860
MDC IDC MSMT BATTERY REMAINING LONGEVITY: 97 mo
MDC IDC MSMT LEADCHNL RV PACING THRESHOLD AMPLITUDE: 0.75 V
MDC IDC MSMT LEADCHNL RV PACING THRESHOLD PULSEWIDTH: 0.4 ms
MDC IDC MSMT LEADCHNL RV SENSING INTR AMPL: 10.25 mV
MDC IDC MSMT LEADCHNL RV SENSING INTR AMPL: 10.25 mV
MDC IDC PG IMPLANT DT: 20150605
MDC IDC SESS DTM: 20190715113731

## 2017-12-30 NOTE — Telephone Encounter (Signed)
Remote ICM transmission received.  Attempted call to sister, [name] and left detailed message, per DPR, regarding transmission and next ICM scheduled for 01/29/2018.  Advised to return call for any fluid symptoms or questions.

## 2017-12-31 ENCOUNTER — Encounter: Payer: Self-pay | Admitting: Cardiology

## 2018-01-29 ENCOUNTER — Telehealth: Payer: Self-pay | Admitting: Cardiology

## 2018-01-29 ENCOUNTER — Ambulatory Visit (INDEPENDENT_AMBULATORY_CARE_PROVIDER_SITE_OTHER): Payer: 59

## 2018-01-29 DIAGNOSIS — Z95810 Presence of automatic (implantable) cardiac defibrillator: Secondary | ICD-10-CM

## 2018-01-29 DIAGNOSIS — I5022 Chronic systolic (congestive) heart failure: Secondary | ICD-10-CM

## 2018-01-29 NOTE — Telephone Encounter (Signed)
Confirmed remote transmission w/ pt sister.   

## 2018-01-30 NOTE — Progress Notes (Signed)
EPIC Encounter for ICM Monitoring  Patient Name: Louis Gaw is a 72 y.o. female Date: 01/30/2018 Primary Care Physican: Patient, No Pcp Per Primary Cardiologist:Taylor Electrophysiologist: Druscilla Brownie Weight:Previous [id number] lbs(per 6/11 office visit)      Attempted call to sister [name].  Left detailed message regarding transmission.  Transmission reviewed.   Thoracic impedance normal.  Nodiuretic  Recommendations:  Left voice mail with ICM number and encouraged to call if experiencing any fluid symptoms.  Follow-up plan: ICM clinic phone appointment on 03/02/2018.    Copy of ICM check sent to Dr. Lovena Le.   3 month ICM trend: 01/29/2018    1 Year ICM trend:       [name], RN 01/30/2018 7:49 AM

## 2018-03-02 ENCOUNTER — Ambulatory Visit (INDEPENDENT_AMBULATORY_CARE_PROVIDER_SITE_OTHER): Payer: 59

## 2018-03-02 DIAGNOSIS — Z95810 Presence of automatic (implantable) cardiac defibrillator: Secondary | ICD-10-CM

## 2018-03-02 DIAGNOSIS — I5022 Chronic systolic (congestive) heart failure: Secondary | ICD-10-CM | POA: Diagnosis not present

## 2018-03-02 NOTE — Progress Notes (Signed)
EPIC Encounter for ICM Monitoring  Patient Name: [name] is a 72 y.o. female Date: 03/02/2018 Primary Care Physican: Patient, No Pcp Per Primary Cardiologist:Taylor Electrophysiologist: Druscilla Brownie [place] [id number] lbs(per 6/11 office visit)         Call to sister [name].  Heart Failure questions reviewed, pt asymptomatic.   Thoracic impedance trending close to baseline.  No diuretic  Recommendations: No changes.   Encouraged to call for fluid symptoms.  Follow-up plan: ICM clinic phone appointment on 04/02/2018.      Copy of ICM check sent to Dr. Lovena Le.   3 month ICM trend: 03/02/2018    1 Year ICM trend:       [name], RN 03/02/2018 1:13 PM

## 2018-04-02 ENCOUNTER — Ambulatory Visit (INDEPENDENT_AMBULATORY_CARE_PROVIDER_SITE_OTHER): Payer: 59

## 2018-04-02 ENCOUNTER — Ambulatory Visit (INDEPENDENT_AMBULATORY_CARE_PROVIDER_SITE_OTHER): Payer: 59 | Admitting: *Deleted

## 2018-04-02 DIAGNOSIS — Z95810 Presence of automatic (implantable) cardiac defibrillator: Secondary | ICD-10-CM

## 2018-04-02 DIAGNOSIS — I5022 Chronic systolic (congestive) heart failure: Secondary | ICD-10-CM | POA: Diagnosis not present

## 2018-04-02 DIAGNOSIS — I429 Cardiomyopathy, unspecified: Secondary | ICD-10-CM

## 2018-04-02 NOTE — Progress Notes (Signed)
Remote ICD transmission.   

## 2018-04-03 ENCOUNTER — Telehealth: Payer: Self-pay

## 2018-04-03 NOTE — Telephone Encounter (Signed)
Remote ICM transmission received.  Attempted call to sister, [name] regarding ICM remote transmission and left detailed message, per DPR, with next ICM remote transmission date of 05/04/2018.  Advised to return call for any fluid symptoms or questions.

## 2018-04-03 NOTE — Progress Notes (Signed)
EPIC Encounter for ICM Monitoring  Patient Name: [name] is a 72 y.o. female Date: 04/03/2018 Primary Care Physican: Patient, No Pcp Per Primary Cardiologist:Taylor Electrophysiologist: Druscilla Brownie Weight:Previous [id number] lbs(per 6/11 office visit)                                         Attempted call to sister [name] and unable to reach.  Left detailed message, per DPR, regarding transmission.  Transmission reviewed.    Thoracic impedance normal.   No diuretic  Recommendations: Left voice mail with ICM number and encouraged to call if experiencing any fluid symptoms.  Follow-up plan: ICM clinic phone appointment on 05/04/2018.    Copy of ICM check sent to Dr. Lovena Le.   3 month ICM trend: 04/02/2018    1 Year ICM trend:       [name], RN 04/03/2018 12:13 PM

## 2018-05-04 ENCOUNTER — Ambulatory Visit (INDEPENDENT_AMBULATORY_CARE_PROVIDER_SITE_OTHER): Payer: 59

## 2018-05-04 ENCOUNTER — Telehealth: Payer: Self-pay

## 2018-05-04 DIAGNOSIS — Z95810 Presence of automatic (implantable) cardiac defibrillator: Secondary | ICD-10-CM | POA: Diagnosis not present

## 2018-05-04 DIAGNOSIS — I5022 Chronic systolic (congestive) heart failure: Secondary | ICD-10-CM | POA: Diagnosis not present

## 2018-05-04 NOTE — Telephone Encounter (Signed)
Confirmed remote transmission w/ pt sister.   

## 2018-05-05 NOTE — Progress Notes (Signed)
EPIC Encounter for ICM Monitoring  Patient Name: [name] is a 72 y.o. female Date: 05/05/2018 Primary Care Physican: Patient, No Pcp Per Primary Cardiologist:Taylor Electrophysiologist: Lovena Le Last Weight: 107 lbs(per 6/11 office visit) Today's Weight: 111 lbs       Spoke to sister [name].  Heart Failure questions reviewed, pt asymptomatic.   Thoracic impedance normal.   Prescribed: No diuretic  Recommendations: No changes.  Encouraged to call for fluid symptoms.  Follow-up plan: ICM clinic phone appointment on 06/04/2018.    Copy of ICM check sent to Dr. Lovena Le.   3 month ICM trend: 05/04/2018    1 Year ICM trend:       [name], RN 05/05/2018 8:54 AM

## 2018-06-04 ENCOUNTER — Ambulatory Visit (INDEPENDENT_AMBULATORY_CARE_PROVIDER_SITE_OTHER): Payer: 59

## 2018-06-04 ENCOUNTER — Telehealth: Payer: Self-pay

## 2018-06-04 DIAGNOSIS — Z95810 Presence of automatic (implantable) cardiac defibrillator: Secondary | ICD-10-CM

## 2018-06-04 DIAGNOSIS — I5022 Chronic systolic (congestive) heart failure: Secondary | ICD-10-CM | POA: Diagnosis not present

## 2018-06-04 LAB — CUP PACEART REMOTE DEVICE CHECK
Date Time Interrogation Session: 20191017131608
HIGH POWER IMPEDANCE MEASURED VALUE: 48 Ohm
Implantable Lead Implant Date: 20150605
Implantable Pulse Generator Implant Date: 20150605
Lead Channel Impedance Value: 304 Ohm
Lead Channel Impedance Value: 342 Ohm
Lead Channel Pacing Threshold Amplitude: 0.75 V
Lead Channel Pacing Threshold Pulse Width: 0.4 ms
Lead Channel Sensing Intrinsic Amplitude: 9.125 mV
Lead Channel Sensing Intrinsic Amplitude: 9.125 mV
MDC IDC LEAD LOCATION: 753860
MDC IDC MSMT BATTERY REMAINING LONGEVITY: 91 mo
MDC IDC MSMT BATTERY VOLTAGE: 2.98 V
MDC IDC SET LEADCHNL RV PACING AMPLITUDE: 2.5 V
MDC IDC SET LEADCHNL RV PACING PULSEWIDTH: 0.4 ms
MDC IDC SET LEADCHNL RV SENSING SENSITIVITY: 0.3 mV
MDC IDC STAT BRADY RV PERCENT PACED: 0 %

## 2018-06-04 NOTE — Telephone Encounter (Signed)
Confirmed remote transmission w/ pt nurse.   

## 2018-06-05 NOTE — Progress Notes (Signed)
EPIC Encounter for ICM Monitoring  Patient Name: [name] is a 72 y.o. female Date: 06/05/2018 Primary Care Physican: Patient, No Pcp Per Primary Cardiologist:Taylor Electrophysiologist: Lovena Le Last Weight: 111 lbs Today's Weight: unknown                                                  Transmission reviewed   Thoracic impedance normal.   Prescribed: No diuretic  Recommendations: None  Follow-up plan: ICM clinic phone appointment on 07/09/2018.    Copy of ICM check sent to Dr. Lovena Le.   3 month ICM trend: 06/05/2018    1 Year ICM trend:       [name], RN 06/05/2018 5:38 PM

## 2018-07-09 ENCOUNTER — Ambulatory Visit (INDEPENDENT_AMBULATORY_CARE_PROVIDER_SITE_OTHER): Payer: 59

## 2018-07-09 ENCOUNTER — Ambulatory Visit: Payer: 59

## 2018-07-09 DIAGNOSIS — I5022 Chronic systolic (congestive) heart failure: Secondary | ICD-10-CM

## 2018-07-09 DIAGNOSIS — I429 Cardiomyopathy, unspecified: Secondary | ICD-10-CM

## 2018-07-09 DIAGNOSIS — Z95810 Presence of automatic (implantable) cardiac defibrillator: Secondary | ICD-10-CM

## 2018-07-10 ENCOUNTER — Encounter: Payer: Self-pay | Admitting: Cardiology

## 2018-07-10 NOTE — Progress Notes (Signed)
Remote ICD transmission.   

## 2018-07-10 NOTE — Progress Notes (Signed)
EPIC Encounter for ICM Monitoring  Patient Name: [name] is a 73 y.o. female Date: 07/10/2018 Primary Care Physican: Patient, No Pcp Per Primary Cardiologist:Taylor Electrophysiologist: Lovena Le Last Weight: 107 lbs(per 6/11 office visit) Today's Weight: 98 lbs                                                   Spoke [employer]. Heart Failure questions reviewed, pt asymptomatic.   Thoracic impedance normal.   Prescribed: No diuretic  Recommendations: No changes.  Encouraged to call for fluid symptoms.  Follow-up plan: ICM clinic phone appointment on 08/10/2018.    Copy of ICM check sent to Dr. Lovena Le.   3 month ICM trend: 07/09/2018    1 Year ICM trend:       [name], RN 07/10/2018 2:32 PM

## 2018-07-12 LAB — CUP PACEART REMOTE DEVICE CHECK
Battery Voltage: 2.98 V
Brady Statistic RV Percent Paced: 0.01 %
HighPow Impedance: 42 Ohm
Implantable Lead Implant Date: 20150605
Implantable Lead Location: 753860
Lead Channel Impedance Value: 285 Ohm
Lead Channel Impedance Value: 342 Ohm
Lead Channel Sensing Intrinsic Amplitude: 10.5 mV
Lead Channel Sensing Intrinsic Amplitude: 10.5 mV
MDC IDC MSMT BATTERY REMAINING LONGEVITY: 79 mo
MDC IDC MSMT LEADCHNL RV PACING THRESHOLD AMPLITUDE: 0.75 V
MDC IDC MSMT LEADCHNL RV PACING THRESHOLD PULSEWIDTH: 0.4 ms
MDC IDC PG IMPLANT DT: 20150605
MDC IDC SESS DTM: 20200123132610
MDC IDC SET LEADCHNL RV PACING AMPLITUDE: 2.5 V
MDC IDC SET LEADCHNL RV PACING PULSEWIDTH: 0.4 ms
MDC IDC SET LEADCHNL RV SENSING SENSITIVITY: 0.3 mV

## 2018-08-10 ENCOUNTER — Ambulatory Visit (INDEPENDENT_AMBULATORY_CARE_PROVIDER_SITE_OTHER): Payer: 59

## 2018-08-10 DIAGNOSIS — I5022 Chronic systolic (congestive) heart failure: Secondary | ICD-10-CM | POA: Diagnosis not present

## 2018-08-10 DIAGNOSIS — Z95810 Presence of automatic (implantable) cardiac defibrillator: Secondary | ICD-10-CM | POA: Diagnosis not present

## 2018-08-11 ENCOUNTER — Telehealth: Payer: Self-pay

## 2018-08-11 NOTE — Progress Notes (Signed)
EPIC Encounter for ICM Monitoring  Patient Name: [name] is a 73 y.o. female Date: 08/11/2018 Primary Cardiologist:Taylor Electrophysiologist: Lovena Le Last Weight:98 lbs Today's Weight: unkwown   Attempted calltosister [name] and unable to reach. Transmission reviewed.  Thoracic impedance normal.   Prescribed:No diuretic  Recommendations: Unable to reach.  Follow-up plan: ICM clinic phone appointment on3/30/2020.   Copy of ICM check sent to Dr.Taylor.    3 month ICM trend: 08/11/2018    1 Year ICM trend:       [name], RN 08/11/2018 10:20 AM

## 2018-08-11 NOTE — Telephone Encounter (Signed)
Remote ICM transmission received.  Attempted call to sister regarding ICM remote transmission and no answer.

## 2018-09-14 ENCOUNTER — Ambulatory Visit (INDEPENDENT_AMBULATORY_CARE_PROVIDER_SITE_OTHER): Payer: 59

## 2018-09-14 ENCOUNTER — Other Ambulatory Visit: Payer: Self-pay

## 2018-09-14 DIAGNOSIS — I5022 Chronic systolic (congestive) heart failure: Secondary | ICD-10-CM | POA: Diagnosis not present

## 2018-09-14 DIAGNOSIS — Z95810 Presence of automatic (implantable) cardiac defibrillator: Secondary | ICD-10-CM | POA: Diagnosis not present

## 2018-09-15 NOTE — Progress Notes (Signed)
EPIC Encounter for ICM Monitoring  Patient Name: [name] is a 73 y.o. female Date: 09/15/2018 Primary Care Physican: Patient, No Pcp Per Primary Cardiologist:Taylor Electrophysiologist: Lovena Le Last Weight:98 lbs 09/15/2018 Weight:  unkwown   Attempted calltosister [name] and unable to reach. Transmission reviewed.  Thoracic impedance normal.   Prescribed:No diuretic  Recommendations:Unable to reach.  Follow-up plan: ICM clinic phone appointment on5/09/2018.   Copy of ICM check sent to Dr.Taylor.   3 month ICM trend: 09/14/2018    1 Year ICM trend:       [name], RN 09/15/2018 9:06 AM

## 2018-09-16 ENCOUNTER — Telehealth: Payer: Self-pay

## 2018-09-16 NOTE — Telephone Encounter (Signed)
Remote ICM transmission received.  Attempted call to sister, [name], regarding ICM remote transmission and left detailed message, per DPR, with next ICM remote transmission date of 10/19/2018.  Advised to return call for any fluid symptoms or questions.

## 2018-10-08 ENCOUNTER — Other Ambulatory Visit: Payer: Self-pay

## 2018-10-08 ENCOUNTER — Ambulatory Visit (INDEPENDENT_AMBULATORY_CARE_PROVIDER_SITE_OTHER): Payer: 59 | Admitting: *Deleted

## 2018-10-08 DIAGNOSIS — I5022 Chronic systolic (congestive) heart failure: Secondary | ICD-10-CM | POA: Diagnosis not present

## 2018-10-08 DIAGNOSIS — I429 Cardiomyopathy, unspecified: Secondary | ICD-10-CM

## 2018-10-08 LAB — CUP PACEART REMOTE DEVICE CHECK
Battery Remaining Longevity: 78 mo
Battery Voltage: 2.99 V
Brady Statistic RV Percent Paced: 0.01 %
Date Time Interrogation Session: 20200423174237
HighPow Impedance: 47 Ohm
Implantable Lead Implant Date: 20150605
Implantable Lead Location: 753860
Implantable Pulse Generator Implant Date: 20150605
Lead Channel Impedance Value: 285 Ohm
Lead Channel Impedance Value: 342 Ohm
Lead Channel Pacing Threshold Amplitude: 0.625 V
Lead Channel Pacing Threshold Pulse Width: 0.4 ms
Lead Channel Sensing Intrinsic Amplitude: 10 mV
Lead Channel Setting Pacing Amplitude: 2.5 V
Lead Channel Setting Pacing Pulse Width: 0.4 ms
Lead Channel Setting Sensing Sensitivity: 0.3 mV

## 2018-10-16 NOTE — Progress Notes (Signed)
Remote ICD transmission.   

## 2018-10-19 ENCOUNTER — Ambulatory Visit (INDEPENDENT_AMBULATORY_CARE_PROVIDER_SITE_OTHER): Payer: 59

## 2018-10-19 ENCOUNTER — Other Ambulatory Visit: Payer: Self-pay

## 2018-10-19 DIAGNOSIS — Z95810 Presence of automatic (implantable) cardiac defibrillator: Secondary | ICD-10-CM | POA: Diagnosis not present

## 2018-10-19 DIAGNOSIS — I5022 Chronic systolic (congestive) heart failure: Secondary | ICD-10-CM

## 2018-10-20 ENCOUNTER — Telehealth: Payer: Self-pay

## 2018-10-20 NOTE — Telephone Encounter (Signed)
Remote ICM transmission received.  Attempted call to sister [name], regarding ICM remote transmission and left detailed message, per DPR, with next ICM remote transmission date of 11/23/2018.  Advised to return call for any fluid symptoms or questions.

## 2018-10-20 NOTE — Progress Notes (Signed)
EPIC Encounter for ICM Monitoring  Patient Name: Lylee Corrow is a 73 y.o. female Date: 10/20/2018 Primary Care Physican: Patient, No Pcp Per Primary Cardiologist:Taylor Electrophysiologist: Lovena Le Last Weight:98lbs 09/15/2018 Weight: unkwown  Attempted calltosister Paulette Jonesand unable to reach. Transmission reviewed.  Thoracic impedance normal.   Prescribed:No diuretic  Recommendations:Unable to reach.  Follow-up plan: ICM clinic phone appointment on6/01/2019.   Copy of ICM check sent to Dr.Taylor.  3 month ICM trend: 10/19/2018   1 Year ICM trend:       [name], RN 10/20/2018 1:12 PM

## 2018-11-10 ENCOUNTER — Telehealth: Payer: Self-pay | Admitting: Internal Medicine

## 2018-11-10 NOTE — Telephone Encounter (Signed)
New message    [facility] for pt to return call about appt on 06.23.20 with Dr. Lovena Le, appt needs to be RS. If pt returns call, please reach out via secure chat. I will speak to pt.

## 2018-11-23 ENCOUNTER — Ambulatory Visit (INDEPENDENT_AMBULATORY_CARE_PROVIDER_SITE_OTHER): Payer: 59

## 2018-11-23 DIAGNOSIS — I5022 Chronic systolic (congestive) heart failure: Secondary | ICD-10-CM | POA: Diagnosis not present

## 2018-11-23 DIAGNOSIS — Z95810 Presence of automatic (implantable) cardiac defibrillator: Secondary | ICD-10-CM | POA: Diagnosis not present

## 2018-11-24 ENCOUNTER — Telehealth: Payer: Self-pay

## 2018-11-24 NOTE — Telephone Encounter (Signed)
Left message for patient to remind of missed remote transmission.  

## 2018-11-25 NOTE — Progress Notes (Signed)
EPIC Encounter for ICM Monitoring  Patient Name: [name] is a 73 y.o. female Date: 11/25/2018 Primary Care Physican: Patient, No Pcp Per Primary Cardiologist:Taylor Electrophysiologist: Lovena Le Last Weight:98lbs 3/31/2020Weight: [name]  Transmission reviewed.  Thoracic impedance normal.   Prescribed:No diuretic  Recommendations:None  Follow-up plan: ICM clinic phone appointment on7/13/2020.   Copy of ICM check sent to Dr.Taylor.  3 month ICM trend: 11/25/2018    1 Year ICM trend:       Tiffany Billings, RN 11/25/2018 1:40 PM

## 2018-12-01 ENCOUNTER — Telehealth (INDEPENDENT_AMBULATORY_CARE_PROVIDER_SITE_OTHER): Payer: 59 | Admitting: Internal Medicine

## 2018-12-01 ENCOUNTER — Other Ambulatory Visit: Payer: Self-pay

## 2018-12-01 DIAGNOSIS — Z95810 Presence of automatic (implantable) cardiac defibrillator: Secondary | ICD-10-CM | POA: Diagnosis not present

## 2018-12-01 DIAGNOSIS — I5022 Chronic systolic (congestive) heart failure: Secondary | ICD-10-CM | POA: Diagnosis not present

## 2018-12-01 NOTE — Progress Notes (Signed)
Electrophysiology TeleHealth Note   Due to national recommendations of social distancing due to COVID 19, an audio/video telehealth visit is felt to be most appropriate for this patient at this time.  See MyChart message from today for the patient's consent to telehealth for [facility].   Date:  12/01/2018   ID:  [name], DOB [birth year], MRN [id number]  Location: patient's home  Provider location: [address] [place]  Evaluation Performed: Follow-up visit  PCP:  Patient, No Pcp Per  Cardiologist:  No primary care provider on file.  Electrophysiologist:  Dr Lovena Le  Chief Complaint:  "I been alright."  History of Present Illness:    [name] is a 73 y.o. female who presents via audio/video conferencing for a telehealth visit today.  Ms. Herbst presents today for followup. She is a pleasant 73 yo woman with a an ICM, s/p remote MI, severe LV dysfunction, s/p ICD insertion. Since last being seen in our clinic, the patient reports doing very well.  Today, she denies symptoms of palpitations, chest pain, shortness of breath,  lower extremity edema, dizziness, presyncope, or syncope.  The patient is otherwise without complaint today.  The patient denies symptoms of fevers, chills, cough, or new SOB worrisome for COVID 19.  Past Medical History:  Diagnosis Date  . 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 Chronic systolic CHF (congestive heart failure), NYHA class 2 ([place])    a. Dx 05/2005;  b. 10/2013 Echo: EF 25-30%, DK and scarring of mid-distal antsept, inf, infsept, & apical myocardium. Severe HK of anterior wall. Gr 1 DD.  Marland Kitchen CVA (cerebral vascular accident) [facility]) 2002, 2008, "mini-stroke"  Oct 2016   Dr. Rob Hickman; "LUE very weak; left leg a little weak but can still walk"   . Deafness in left ear   . Depression   . Dyslipidemia   . Hypertension   . Ischemic cardiomyopathy December  2006   a. 10/2013 Echo: EF 25-30%;  b. 11/2013 MDT single Lead ICD gen change and lead revision.  Marland Kitchen Uterine fibroid     Past Surgical History:  Procedure Laterality Date  . ABDOMINAL HYSTERECTOMY     "partial"  . APPENDECTOMY    . CARDIAC DEFIBRILLATOR PLACEMENT  05/2005; 11/19/2013   MDT ICD implanted 2006 by Dr Lovena Le with [phone] lead; gen change and RV lead revision 11/2013 by Dr Lovena Le  . CHOLECYSTECTOMY    . IMPLANTABLE CARDIOVERTER DEFIBRILLATOR REVISION N/A 11/19/2013   Procedure: IMPLANTABLE CARDIOVERTER DEFIBRILLATOR REVISION;  Surgeon: Evans Lance, MD;  Location: [facility] CATH LAB;  Service: Cardiovascular;  Laterality: N/A;  . TONSILLECTOMY      Current Outpatient Medications  Medication Sig Dispense Refill  . amLODipine (NORVASC) 5 MG tablet Take 5 mg by mouth daily.  6  . carvedilol (COREG) 3.125 MG tablet Take 3.125 mg by mouth 2 (two) times daily.  0  . cetirizine (ZYRTEC) 10 MG tablet Take 10 mg by mouth daily.    . Cholecalciferol (VITAMIN D) 2000 UNITS CAPS Take 1 capsule by mouth daily.      . clopidogrel (PLAVIX) 75 MG tablet Take 75 mg by mouth daily.     Marland Kitchen ELIQUIS 5 MG TABS tablet Take 5 mg by mouth 2 (two) times daily.  3  . ENSURE (ENSURE) Take 237 mLs by mouth daily.     . rosuvastatin (CRESTOR) 20 MG tablet Take 20 mg by mouth  daily.    . tetrahydrozoline 0.05 % ophthalmic solution Place 1 drop into both eyes 2 (two) times daily.    . traMADol (ULTRAM) 50 MG tablet Take 50 mg by mouth every 8 (eight) hours as needed for moderate pain.      No current facility-administered medications for this visit.     Allergies:   Beta adrenergic blockers   Social History:  The patient  reports that she quit smoking about 11 years ago. Her smoking use included cigarettes. She has a 4.20 pack-year smoking history. She has never used smokeless tobacco. She reports that she does not drink alcohol or use drugs.   Family History:  The patient's  family history includes Heart disease  (age of onset: 104) in her mother; Mental retardation in her sister.   ROS:  Please see the history of present illness.   All other systems are personally reviewed and negative.    Exam:    Vital Signs:  There were no vitals taken for this visit.  Well appearing, alert and conversant, regular work of breathing,  good skin color Eyes- anicteric, neuro- grossly intact, skin- no apparent rash or lesions or cyanosis, mouth- oral mucosa is pink   Labs/Other Tests and Data Reviewed:    Recent Labs: No results found for requested labs within last 8760 hours.   Wt Readings from Last 3 Encounters:  11/25/17 107 lb (48.5 kg)  11/05/16 106 lb 3.2 oz (48.2 kg)  10/17/15 111 lb 12.8 oz (50.7 kg)     Other studies personally reviewed:  Last device remote is reviewed from [facility] PDF dated 4/20 which reveals normal device function, no arrhythmias    ASSESSMENT & PLAN:    1.  Chronic systolic heart failure - her symptoms are class 2. She will continue her current meds. 2. ICD - her device interogation from 4/23 demonstrates normal device function. 3. Coags - she will continue eliquis. She previously had a left arm DVT. 4. COVID 19 screen The patient denies symptoms of COVID 19 at this time.  The importance of social distancing was discussed today.  Follow-up:  6 months Next remote: 7/20  Current medicines are reviewed at length with the patient today.   The patient does not have concerns regarding her medicines.  The following changes were made today:  none none Labs/ tests ordered today include: none No orders of the defined types were placed in this encounter.    Patient Risk:  after full review of this patients clinical status, I feel that they are at moderate risk at this time.  Today, I have spent 15 minutes with the patient with telehealth technology discussing all of the above .    Signed, Cristopher Peru, MD  12/01/2018 2:48 PM     [facility] [address]  [place] [address] [phone] (office) [phone] (fax)

## 2018-12-08 ENCOUNTER — Encounter: Payer: 59 | Admitting: Internal Medicine

## 2018-12-28 ENCOUNTER — Ambulatory Visit (INDEPENDENT_AMBULATORY_CARE_PROVIDER_SITE_OTHER): Payer: 59

## 2018-12-28 DIAGNOSIS — I5022 Chronic systolic (congestive) heart failure: Secondary | ICD-10-CM | POA: Diagnosis not present

## 2018-12-28 DIAGNOSIS — Z95810 Presence of automatic (implantable) cardiac defibrillator: Secondary | ICD-10-CM | POA: Diagnosis not present

## 2019-01-01 NOTE — Progress Notes (Signed)
EPIC Encounter for ICM Monitoring  Patient Name: [name] is a 73 y.o. female Date: 01/01/2019 Primary Care Physican: Patient, No Pcp Per Primary Cardiologist:Taylor Electrophysiologist: Lovena Le Last Weight:98lbs  Transmission reviewed.  Thoracic impedance normal.   Prescribed:No diuretic  Recommendations:None  Follow-up plan: ICM clinic phone appointment on8/17/2020.   Copy of ICM check sent to Dr.Taylor.  3 month ICM trend: 12/30/2018    1 Year ICM trend:       Tiffany Billings, RN 01/01/2019 9:19 AM

## 2019-01-07 ENCOUNTER — Ambulatory Visit (INDEPENDENT_AMBULATORY_CARE_PROVIDER_SITE_OTHER): Payer: 59 | Admitting: *Deleted

## 2019-01-07 DIAGNOSIS — I5022 Chronic systolic (congestive) heart failure: Secondary | ICD-10-CM | POA: Diagnosis not present

## 2019-01-09 LAB — CUP PACEART REMOTE DEVICE CHECK
Battery Remaining Longevity: 66 mo
Battery Voltage: 2.96 V
Brady Statistic RV Percent Paced: 0 %
Date Time Interrogation Session: 20200725082548
HighPow Impedance: 41 Ohm
Implantable Lead Implant Date: 20150605
Implantable Lead Location: 753860
Implantable Pulse Generator Implant Date: 20150605
Lead Channel Impedance Value: 285 Ohm
Lead Channel Impedance Value: 304 Ohm
Lead Channel Pacing Threshold Amplitude: 0.625 V
Lead Channel Pacing Threshold Pulse Width: 0.4 ms
Lead Channel Sensing Intrinsic Amplitude: 10.625 mV
Lead Channel Sensing Intrinsic Amplitude: 10.625 mV
Lead Channel Setting Pacing Amplitude: 2.5 V
Lead Channel Setting Pacing Pulse Width: 0.4 ms
Lead Channel Setting Sensing Sensitivity: 0.3 mV

## 2019-01-18 NOTE — Progress Notes (Signed)
Remote ICD transmission.   

## 2019-02-01 ENCOUNTER — Ambulatory Visit (INDEPENDENT_AMBULATORY_CARE_PROVIDER_SITE_OTHER): Payer: 59

## 2019-02-01 DIAGNOSIS — Z95810 Presence of automatic (implantable) cardiac defibrillator: Secondary | ICD-10-CM

## 2019-02-01 DIAGNOSIS — I5022 Chronic systolic (congestive) heart failure: Secondary | ICD-10-CM

## 2019-02-02 ENCOUNTER — Telehealth: Payer: Self-pay

## 2019-02-02 NOTE — Telephone Encounter (Signed)
Unable to leave a message for patient to remind of missed remote transmission.  

## 2019-02-03 ENCOUNTER — Telehealth: Payer: Self-pay

## 2019-02-03 DIAGNOSIS — Z95810 Presence of automatic (implantable) cardiac defibrillator: Secondary | ICD-10-CM

## 2019-02-03 DIAGNOSIS — I5022 Chronic systolic (congestive) heart failure: Secondary | ICD-10-CM | POA: Diagnosis not present

## 2019-02-03 NOTE — Progress Notes (Signed)
EPIC Encounter for ICM Monitoring  Patient Name: [name] is a 73 y.o. female Date: 02/03/2019 Primary Care Physican: Patient, No Pcp Per Primary Cardiologist:Taylor Electrophysiologist: Lovena Le Last Weight:98lbs  Attempted call to sister [name] and unable to reach.   Transmission reviewed.   Thoracic impedance normal.   Prescribed:No diuretic  Recommendations:Unable to reach  Follow-up plan: ICM clinic phone appointment on10/05/2019.   Copy of ICM check sent to Dr.Taylor.   3 month ICM trend: 02/02/2019    1 Year ICM trend:       [name], RN 02/03/2019 1:52 PM

## 2019-02-03 NOTE — Telephone Encounter (Signed)
Remote ICM transmission received.  Attempted call to sister [name] regarding ICM remote transmission and no answer or option to leave voice mail message.

## 2019-03-29 ENCOUNTER — Other Ambulatory Visit: Payer: Self-pay

## 2019-03-29 ENCOUNTER — Ambulatory Visit (INDEPENDENT_AMBULATORY_CARE_PROVIDER_SITE_OTHER): Payer: 59

## 2019-03-29 DIAGNOSIS — Z95810 Presence of automatic (implantable) cardiac defibrillator: Secondary | ICD-10-CM | POA: Diagnosis not present

## 2019-03-29 DIAGNOSIS — I5022 Chronic systolic (congestive) heart failure: Secondary | ICD-10-CM | POA: Diagnosis not present

## 2019-03-30 ENCOUNTER — Telehealth: Payer: Self-pay

## 2019-03-30 NOTE — Telephone Encounter (Signed)
Left message for patient to remind of missed remote transmission.  

## 2019-03-31 NOTE — Progress Notes (Signed)
EPIC Encounter for ICM Monitoring  Patient Name: [name] is a 73 y.o. female Date: 03/31/2019 Primary Care Physican: Patient, No Pcp Per Primary Cardiologist:Taylor Electrophysiologist: Lovena Le Last Weight:98lbs  Transmission reviewed.   Optivol thoracic impedance normal.   Prescribed:No diuretic  Recommendations: None.  Follow-up plan: ICM clinic phone appointment on 05/03/2019.   91 day device clinic remote transmission 07/09/2019.  Office appt 06/08/2019 with Dr. Lovena Le.    Copy of ICM check sent to Dr. Lovena Le.   3 month ICM trend: 03/31/2019    1 Year ICM trend:       [name], RN 03/31/2019 11:08 AM

## 2019-04-09 ENCOUNTER — Ambulatory Visit (INDEPENDENT_AMBULATORY_CARE_PROVIDER_SITE_OTHER): Payer: 59 | Admitting: *Deleted

## 2019-04-09 DIAGNOSIS — I5022 Chronic systolic (congestive) heart failure: Secondary | ICD-10-CM | POA: Diagnosis not present

## 2019-04-09 DIAGNOSIS — I255 Ischemic cardiomyopathy: Secondary | ICD-10-CM

## 2019-04-11 LAB — CUP PACEART REMOTE DEVICE CHECK
Battery Remaining Longevity: 65 mo
Battery Voltage: 2.94 V
Brady Statistic RV Percent Paced: 0 %
Date Time Interrogation Session: 20201024170208
HighPow Impedance: 48 Ohm
Implantable Lead Implant Date: 20150605
Implantable Lead Location: 753860
Implantable Pulse Generator Implant Date: 20150605
Lead Channel Impedance Value: 285 Ohm
Lead Channel Impedance Value: 342 Ohm
Lead Channel Pacing Threshold Amplitude: 0.625 V
Lead Channel Pacing Threshold Pulse Width: 0.4 ms
Lead Channel Sensing Intrinsic Amplitude: 10.25 mV
Lead Channel Sensing Intrinsic Amplitude: 10.25 mV
Lead Channel Setting Pacing Amplitude: 2.5 V
Lead Channel Setting Pacing Pulse Width: 0.4 ms
Lead Channel Setting Sensing Sensitivity: 0.3 mV

## 2019-04-16 NOTE — Progress Notes (Signed)
Remote ICD transmission.   

## 2019-05-04 ENCOUNTER — Telehealth: Payer: Self-pay

## 2019-05-04 NOTE — Telephone Encounter (Signed)
Left message for patient to remind of missed remote transmission.  

## 2019-05-12 NOTE — Progress Notes (Signed)
No ICM remote transmission received for 05/03/2019 and next ICM transmission scheduled for 07/13/2019.  Office visit defib check with Dr Lovena Le 06/08/2019

## 2019-06-08 ENCOUNTER — Encounter: Payer: 59 | Admitting: Internal Medicine

## 2019-07-12 ENCOUNTER — Ambulatory Visit (INDEPENDENT_AMBULATORY_CARE_PROVIDER_SITE_OTHER): Payer: 59 | Admitting: *Deleted

## 2019-07-12 DIAGNOSIS — I5022 Chronic systolic (congestive) heart failure: Secondary | ICD-10-CM

## 2019-07-12 LAB — CUP PACEART REMOTE DEVICE CHECK
Battery Remaining Longevity: 66 mo
Battery Voltage: 2.98 V
Brady Statistic RV Percent Paced: 0.01 %
Date Time Interrogation Session: 20210125091610
HighPow Impedance: 48 Ohm
Implantable Lead Implant Date: 20150605
Implantable Lead Location: 753860
Implantable Pulse Generator Implant Date: 20150605
Lead Channel Impedance Value: 285 Ohm
Lead Channel Impedance Value: 342 Ohm
Lead Channel Pacing Threshold Amplitude: 0.625 V
Lead Channel Pacing Threshold Pulse Width: 0.4 ms
Lead Channel Sensing Intrinsic Amplitude: 10.625 mV
Lead Channel Sensing Intrinsic Amplitude: 10.625 mV
Lead Channel Setting Pacing Amplitude: 2.5 V
Lead Channel Setting Pacing Pulse Width: 0.4 ms
Lead Channel Setting Sensing Sensitivity: 0.3 mV

## 2019-07-13 ENCOUNTER — Ambulatory Visit (INDEPENDENT_AMBULATORY_CARE_PROVIDER_SITE_OTHER): Payer: 59

## 2019-07-13 DIAGNOSIS — Z95810 Presence of automatic (implantable) cardiac defibrillator: Secondary | ICD-10-CM | POA: Diagnosis not present

## 2019-07-13 DIAGNOSIS — I5022 Chronic systolic (congestive) heart failure: Secondary | ICD-10-CM

## 2019-07-19 ENCOUNTER — Telehealth: Payer: Self-pay

## 2019-07-19 NOTE — Progress Notes (Signed)
EPIC Encounter for ICM Monitoring  Patient Name: [name] is a 74 y.o. female Date: 07/19/2019 Primary Care Physican: Patient, No Pcp Per Primary Cardiologist:Taylor Electrophysiologist: Lovena Le Last Weight:98lbs  Attempted calltosister [name]  Optivol thoracic impedance normal.   Prescribed:No diuretic  Recommendations: Left voice mail with ICM number and encouraged to call if experiencing any fluid symptoms..  Follow-up plan: ICM clinic phone appointment on 08/16/2018.   91 day device clinic remote transmission 10/11/2019.  Office appt 07/30/2019 with Dr. Lovena Le.    Copy of ICM check sent to Dr. Lovena Le.   3 month ICM trend: 07/12/2019    1 Year ICM trend:       [name], RN 07/19/2019 12:52 PM

## 2019-07-19 NOTE — Telephone Encounter (Signed)
Remote ICM transmission received.  Attempted call to patient regarding ICM remote transmission and left detailed message per DPR.  Advised to return call for any fluid symptoms or questions. Next ICM remote transmission scheduled 08/16/2019.

## 2019-07-30 ENCOUNTER — Other Ambulatory Visit: Payer: Self-pay

## 2019-07-30 ENCOUNTER — Ambulatory Visit (INDEPENDENT_AMBULATORY_CARE_PROVIDER_SITE_OTHER): Payer: 59 | Admitting: Internal Medicine

## 2019-07-30 ENCOUNTER — Encounter: Payer: Self-pay | Admitting: Internal Medicine

## 2019-07-30 VITALS — BP 128/68 | HR 74 | Ht 60.0 in | Wt 103.0 lb

## 2019-07-30 DIAGNOSIS — I5022 Chronic systolic (congestive) heart failure: Secondary | ICD-10-CM | POA: Diagnosis not present

## 2019-07-30 DIAGNOSIS — I255 Ischemic cardiomyopathy: Secondary | ICD-10-CM | POA: Diagnosis not present

## 2019-07-30 DIAGNOSIS — Z95810 Presence of automatic (implantable) cardiac defibrillator: Secondary | ICD-10-CM

## 2019-07-30 LAB — CUP PACEART INCLINIC DEVICE CHECK
Battery Remaining Longevity: 63 mo
Battery Voltage: 2.98 V
Brady Statistic RV Percent Paced: 0.01 %
Date Time Interrogation Session: 20210212165926
HighPow Impedance: 50 Ohm
Implantable Lead Implant Date: 20150605
Implantable Lead Location: 753860
Implantable Pulse Generator Implant Date: 20150605
Lead Channel Impedance Value: 285 Ohm
Lead Channel Impedance Value: 342 Ohm
Lead Channel Pacing Threshold Amplitude: 0.75 V
Lead Channel Pacing Threshold Pulse Width: 0.4 ms
Lead Channel Sensing Intrinsic Amplitude: 14 mV
Lead Channel Setting Pacing Amplitude: 2.5 V
Lead Channel Setting Pacing Pulse Width: 0.4 ms
Lead Channel Setting Sensing Sensitivity: 0.3 mV

## 2019-07-30 NOTE — Patient Instructions (Signed)

## 2019-07-30 NOTE — Progress Notes (Signed)
HPI [name] returns today for followup. 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 The patient has done well in the interim. She denies chest pain or sob. She has not received the Covid vaccine. She has not had edema. Her weight is down a couple of pounds.  Allergies  Allergen Reactions  . Beta Adrenergic Blockers Other (See Comments)    fatigue     Current Outpatient Medications  Medication Sig Dispense Refill  . amLODipine (NORVASC) 5 MG tablet Take 5 mg by mouth daily.  6  . carvedilol (COREG) 3.125 MG tablet Take 3.125 mg by mouth 2 (two) times daily.  0  . Cholecalciferol (VITAMIN D) 2000 UNITS CAPS Take 1 capsule by mouth daily.      . clopidogrel (PLAVIX) 75 MG tablet Take 75 mg by mouth daily.     Marland Kitchen ENSURE (ENSURE) Take 237 mLs by mouth daily.     . rosuvastatin (CRESTOR) 20 MG tablet Take 20 mg by mouth daily.    Marland Kitchen tetrahydrozoline 0.05 % ophthalmic solution Place 1 drop into both eyes 2 (two) times daily.     No current facility-administered medications for this visit.     Past Medical History:  Diagnosis Date  . 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 Chronic systolic CHF (congestive heart failure), NYHA class 2 ([place])    a. Dx 05/2005;  b. 10/2013 Echo: EF 25-30%, DK and scarring of mid-distal antsept, inf, infsept, & apical myocardium. Severe HK of anterior wall. Gr 1 DD.  Marland Kitchen CVA (cerebral vascular accident) [facility]) 2002, 2008, "mini-stroke"  Oct 2016   Dr. Rob Hickman; "LUE very weak; left leg a little weak but can still walk"   . Deafness in left ear   . Depression   . Dyslipidemia   . Hypertension   . Ischemic cardiomyopathy December 2006   a. 10/2013 Echo: EF 25-30%;  b. 11/2013 MDT single Lead ICD gen change and lead revision.  Marland Kitchen Uterine fibroid     ROS:   All systems reviewed and negative except as noted in the HPI.   Past Surgical  History:  Procedure Laterality Date  . ABDOMINAL HYSTERECTOMY     "partial"  . APPENDECTOMY    . CARDIAC DEFIBRILLATOR PLACEMENT  05/2005; 11/19/2013   MDT ICD implanted 2006 by Dr Lovena Le with [phone] lead; gen change and RV lead revision 11/2013 by Dr Lovena Le  . CHOLECYSTECTOMY    . IMPLANTABLE CARDIOVERTER DEFIBRILLATOR REVISION N/A 11/19/2013   Procedure: IMPLANTABLE CARDIOVERTER DEFIBRILLATOR REVISION;  Surgeon: Evans Lance, MD;  Location: [facility] CATH LAB;  Service: Cardiovascular;  Laterality: N/A;  . TONSILLECTOMY       Family History  Problem Relation Age of Onset  . Heart disease Mother 42       Cerebrovascular Accident  . Mental retardation Sister      Social History   Socioeconomic History  . Marital status: Legally Separated    Spouse name: Not on file  . Number of children: Not on file  . Years of education: Not on file  . Highest education level: Not on file  Occupational History  . Occupation: disabled  Tobacco Use  . Smoking status: Former Smoker    Packs/day: 0.10    Years: 42.00    Pack years: 4.20    Types: Cigarettes  Quit date: 06/18/2007    Years since quitting: 12.1  . Smokeless tobacco: Never Used  Substance and Sexual Activity  . Alcohol use: No    Alcohol/week: 0.0 standard drinks    Comment: "stopped drinking alcohol in the 1990's"  . Drug use: No    Types: Cocaine    Comment: "stopped using drugs "before 2009"  . Sexual activity: Never  Other Topics Concern  . Not on file  Social History Narrative  . Not on file   Social Determinants of Health   Financial Resource Strain:   . Difficulty of Paying Living Expenses: Not on file  Food Insecurity:   . Worried About Charity fundraiser in the Last Year: Not on file  . Ran Out of Food in the Last Year: Not on file  Transportation Needs:   . Lack of Transportation (Medical): Not on file  . Lack of Transportation (Non-Medical): Not on file  Physical Activity:   . Days of Exercise per Week: Not  on file  . Minutes of Exercise per Session: Not on file  Stress:   . Feeling of Stress : Not on file  Social Connections:   . Frequency of Communication with Friends and Family: Not on file  . Frequency of Social Gatherings with Friends and Family: Not on file  . Attends Religious Services: Not on file  . Active Member of Clubs or Organizations: Not on file  . Attends Archivist Meetings: Not on file  . Marital Status: Not on file  Intimate Partner Violence:   . Fear of Current or Ex-Partner: Not on file  . Emotionally Abused: Not on file  . Physically Abused: Not on file  . Sexually Abused: Not on file     BP 128/68   Pulse 74   Ht 5' (1.524 m)   Wt 103 lb (46.7 kg)   SpO2 98%   BMI 20.12 kg/m   Physical Exam:  Well appearing NAD HEENT: Unremarkable Neck:  No JVD, no thyromegally Lymphatics:  No adenopathy Back:  No CVA tenderness Lungs:  Clear with no wheezes HEART:  Regular rate rhythm, no murmurs, no rubs, no clicks Abd:  soft, positive bowel sounds, no organomegally, no rebound, no guarding Ext:  2 plus pulses, no edema, no cyanosis, no clubbing Skin:  No rashes no nodules Neuro:  CN II through XII intact, motor grossly intact  EKG - nsr with AS MI  DEVICE  Normal device function.  See PaceArt for details.   Assess/Plan: 1. Chronic systolic heart failure - her symptoms are class 2. She will continue her current meds. 2. ICM - she denies anginal symptoms.  3. ICD - her [employer]tronic single chamber ICD is working normally. We will recheck in several months.  Lyman Bishop.D.

## 2019-08-16 ENCOUNTER — Ambulatory Visit (INDEPENDENT_AMBULATORY_CARE_PROVIDER_SITE_OTHER): Payer: 59

## 2019-08-16 DIAGNOSIS — Z95810 Presence of automatic (implantable) cardiac defibrillator: Secondary | ICD-10-CM | POA: Diagnosis not present

## 2019-08-16 DIAGNOSIS — I5022 Chronic systolic (congestive) heart failure: Secondary | ICD-10-CM

## 2019-08-18 NOTE — Progress Notes (Signed)
EPIC Encounter for ICM Monitoring  Patient Name: [name] is a 74 y.o. female Date: 08/18/2019 Primary Care Physican: Patient, No Pcp Per Primary Cardiologist:Taylor Electrophysiologist: Lovena Le Last Weight:98lbs  Transmission reviewed.  Optivol thoracic impedance normal.   Prescribed:No diuretic  Recommendations: None  Follow-up plan: ICM clinic phone appointment on4/10/2019. 91 day device clinic remote transmission 10/11/2019.    Copy of ICM check sent to Dr. Lovena Le.    3 month ICM trend: 08/16/2019    1 Year ICM trend:       [name], RN 08/18/2019 1:11 PM

## 2019-09-20 ENCOUNTER — Ambulatory Visit (INDEPENDENT_AMBULATORY_CARE_PROVIDER_SITE_OTHER): Payer: 59

## 2019-09-20 DIAGNOSIS — Z95810 Presence of automatic (implantable) cardiac defibrillator: Secondary | ICD-10-CM | POA: Diagnosis not present

## 2019-09-20 DIAGNOSIS — I5022 Chronic systolic (congestive) heart failure: Secondary | ICD-10-CM

## 2019-09-22 NOTE — Progress Notes (Signed)
EPIC Encounter for ICM Monitoring  Patient Name: [name] is a 74 y.o. female Date: 09/22/2019 Primary Care Physican: Patient, No Pcp Per Electrophysiologist: Lovena Le Last Weight:98lbs  Transmission reviewed.  Optivol thoracic impedance normal.   Prescribed:No diuretic  Recommendations: None  Follow-up plan: ICM clinic phone appointment on5/04/2020. 91 day device clinic remote transmission4/26/2021.    Copy of ICM check sent to Dr. Lovena Le  3 month ICM trend: 09/20/2019    1 Year ICM trend:       [name], RN 09/22/2019 9:16 AM

## 2019-10-12 ENCOUNTER — Telehealth: Payer: Self-pay

## 2019-10-12 NOTE — Telephone Encounter (Signed)
Spoke with patient to remind of missed remote transmission 

## 2019-10-25 ENCOUNTER — Telehealth: Payer: Self-pay

## 2019-10-25 NOTE — Telephone Encounter (Signed)
The pt sister called wanting to know if the pt life alert works? I told her I do not know anything about the life alert but her icd monitor is working.

## 2019-10-27 ENCOUNTER — Telehealth: Payer: Self-pay

## 2019-10-27 NOTE — Telephone Encounter (Signed)
Spoke with patient to remind of missed remote transmission. Waiting for new monitor from [employer].

## 2019-10-29 NOTE — Progress Notes (Unsigned)
No ICM remote transmission received for 10/26/2019 and next ICM transmission scheduled for 11/22/2019.

## 2019-11-01 ENCOUNTER — Ambulatory Visit (INDEPENDENT_AMBULATORY_CARE_PROVIDER_SITE_OTHER): Payer: 59 | Admitting: *Deleted

## 2019-11-01 DIAGNOSIS — I429 Cardiomyopathy, unspecified: Secondary | ICD-10-CM

## 2019-11-01 DIAGNOSIS — I5022 Chronic systolic (congestive) heart failure: Secondary | ICD-10-CM

## 2019-11-02 LAB — CUP PACEART REMOTE DEVICE CHECK
Battery Remaining Longevity: 51 mo
Battery Voltage: 2.98 V
Brady Statistic RV Percent Paced: 0 %
Date Time Interrogation Session: 20210517162406
HighPow Impedance: 49 Ohm
Implantable Lead Implant Date: 20150605
Implantable Lead Location: 753860
Implantable Pulse Generator Implant Date: 20150605
Lead Channel Impedance Value: 285 Ohm
Lead Channel Impedance Value: 361 Ohm
Lead Channel Pacing Threshold Amplitude: 0.625 V
Lead Channel Pacing Threshold Pulse Width: 0.4 ms
Lead Channel Sensing Intrinsic Amplitude: 11.625 mV
Lead Channel Sensing Intrinsic Amplitude: 11.625 mV
Lead Channel Setting Pacing Amplitude: 2.5 V
Lead Channel Setting Pacing Pulse Width: 0.4 ms
Lead Channel Setting Sensing Sensitivity: 0.3 mV

## 2019-11-03 NOTE — Progress Notes (Signed)
Remote ICD transmission.   

## 2019-11-22 ENCOUNTER — Ambulatory Visit: Payer: 59

## 2019-11-22 DIAGNOSIS — Z95810 Presence of automatic (implantable) cardiac defibrillator: Secondary | ICD-10-CM

## 2019-11-22 DIAGNOSIS — I5022 Chronic systolic (congestive) heart failure: Secondary | ICD-10-CM

## 2019-11-23 NOTE — Progress Notes (Signed)
EPIC Encounter for ICM Monitoring  Patient Name: [name] is a 74 y.o. female Date: 11/23/2019 Primary Care Physican: Patient, No Pcp Per Electrophysiologist: Lovena Le Last Weight:98lbs  Transmission reviewed.  Optivol thoracic impedance normal.   Prescribed:No diuretic  Recommendations:None  Follow-up plan: ICM clinic phone appointment on7/05/2020. 91 day device clinic remote transmission8/16/2021.    Copy of ICM check sent to Dr. Lovena Le  3 month ICM trend: 11/22/2019    1 Year ICM trend:       [name], RN 11/23/2019 1:37 PM

## 2019-12-27 ENCOUNTER — Ambulatory Visit (INDEPENDENT_AMBULATORY_CARE_PROVIDER_SITE_OTHER): Payer: 59

## 2019-12-27 DIAGNOSIS — I5022 Chronic systolic (congestive) heart failure: Secondary | ICD-10-CM

## 2019-12-27 DIAGNOSIS — Z95810 Presence of automatic (implantable) cardiac defibrillator: Secondary | ICD-10-CM

## 2019-12-29 ENCOUNTER — Telehealth: Payer: Self-pay

## 2019-12-29 NOTE — Telephone Encounter (Signed)
The pt sister is going to send missed transmission today around 3 pm.

## 2019-12-31 NOTE — Progress Notes (Signed)
EPIC Encounter for ICM Monitoring  Patient Name: [name] is a 74 y.o. female Date: 12/31/2019 Primary Care Physican: Patient, No Pcp Per Primary Cardiologist: Lovena Le Electrophysiologist: Lovena Le Last Weight:98lbs  Transmission reviewed.  Optivol thoracic impedance normal.   Prescribed:No diuretic  Recommendations:None  Follow-up plan: ICM clinic phone appointment on8/17/2021. 91 day device clinic remote transmission8/16/2021.      EP/Cardiology Office Visits: Recall for 07/30/2020 with Dr. Lovena Le.    Copy of ICM check sent to Dr. Lovena Le.   3 month ICM trend: 12/29/2019    1 Year ICM trend:       [name], RN 12/31/2019 8:54 AM

## 2020-01-31 ENCOUNTER — Ambulatory Visit (INDEPENDENT_AMBULATORY_CARE_PROVIDER_SITE_OTHER): Payer: 59 | Admitting: *Deleted

## 2020-01-31 DIAGNOSIS — I5022 Chronic systolic (congestive) heart failure: Secondary | ICD-10-CM

## 2020-01-31 LAB — CUP PACEART REMOTE DEVICE CHECK
Battery Remaining Longevity: 47 mo
Battery Voltage: 2.98 V
Brady Statistic RV Percent Paced: 0.01 %
Date Time Interrogation Session: 20210816001805
HighPow Impedance: 53 Ohm
Implantable Lead Implant Date: 20150605
Implantable Lead Location: 753860
Implantable Pulse Generator Implant Date: 20150605
Lead Channel Impedance Value: 285 Ohm
Lead Channel Impedance Value: 342 Ohm
Lead Channel Pacing Threshold Amplitude: 0.625 V
Lead Channel Pacing Threshold Pulse Width: 0.4 ms
Lead Channel Sensing Intrinsic Amplitude: 10.5 mV
Lead Channel Sensing Intrinsic Amplitude: 10.5 mV
Lead Channel Setting Pacing Amplitude: 2.5 V
Lead Channel Setting Pacing Pulse Width: 0.4 ms
Lead Channel Setting Sensing Sensitivity: 0.3 mV

## 2020-02-01 ENCOUNTER — Ambulatory Visit (INDEPENDENT_AMBULATORY_CARE_PROVIDER_SITE_OTHER): Payer: 59

## 2020-02-01 DIAGNOSIS — Z95810 Presence of automatic (implantable) cardiac defibrillator: Secondary | ICD-10-CM

## 2020-02-01 DIAGNOSIS — I5022 Chronic systolic (congestive) heart failure: Secondary | ICD-10-CM

## 2020-02-01 NOTE — Progress Notes (Signed)
Remote ICD transmission.   

## 2020-02-04 NOTE — Progress Notes (Signed)
EPIC Encounter for ICM Monitoring  Patient Name: [name] is a 74 y.o. female Date: 02/04/2020 Primary Care Physican: Patient, No Pcp Per Primary Cardiologist: Lovena Le Electrophysiologist: Lovena Le Last Weight:98lbs  Transmission reviewed.  Optivol thoracic impedance normal.   Prescribed:No diuretic  Recommendations:None  Follow-up plan: ICM clinic phone appointment on9/20/2021. 91 day device clinic remote transmission11/15/2021.      EP/Cardiology Office Visits: Recall for 07/30/2020 with Dr. Lovena Le.    Copy of ICM check sent to Dr. Lovena Le.   3 month ICM trend: 01/31/2020    1 Year ICM trend:       [name], RN 02/04/2020 4:32 PM

## 2020-03-06 ENCOUNTER — Ambulatory Visit (INDEPENDENT_AMBULATORY_CARE_PROVIDER_SITE_OTHER): Payer: 59

## 2020-03-06 DIAGNOSIS — Z95810 Presence of automatic (implantable) cardiac defibrillator: Secondary | ICD-10-CM

## 2020-03-06 DIAGNOSIS — I5022 Chronic systolic (congestive) heart failure: Secondary | ICD-10-CM | POA: Diagnosis not present

## 2020-03-08 NOTE — Progress Notes (Signed)
EPIC Encounter for ICM Monitoring  Patient Name: [name] is a 74 y.o. female Date: 03/08/2020 Primary Care Physican: Patient, No Pcp Per Primary Cardiologist:Taylor Electrophysiologist: Lovena Le Last Weight:98lbs  Transmission reviewed.  Optivol thoracic impedance trending close to baseline normal.   Prescribed:No diuretic  Recommendations:None  Follow-up plan: ICM clinic phone appointment on10/25/2021. 91 day device clinic remote transmission11/15/2021.    EP/Cardiology Office Visits:Recall for 2/13/2022with Dr. Lovena Le.   Copy of ICM check sent to Dr.Taylor.   3 month ICM trend: 03/06/2020    1 Year ICM trend:       [name], RN 03/08/2020 3:32 PM

## 2020-04-10 ENCOUNTER — Ambulatory Visit (INDEPENDENT_AMBULATORY_CARE_PROVIDER_SITE_OTHER): Payer: 59

## 2020-04-10 DIAGNOSIS — Z95810 Presence of automatic (implantable) cardiac defibrillator: Secondary | ICD-10-CM | POA: Diagnosis not present

## 2020-04-10 DIAGNOSIS — I5022 Chronic systolic (congestive) heart failure: Secondary | ICD-10-CM

## 2020-04-14 NOTE — Progress Notes (Signed)
EPIC Encounter for ICM Monitoring  Patient Name: [name] is a 74 y.o. female Date: 04/14/2020 Primary Care Physican: Patient, No Pcp Per Primary Cardiologist:Taylor Electrophysiologist: Lovena Le Last Weight:98lbs  Transmission reviewed.  Optivol thoracic impedance normal.   Prescribed:No diuretic  Recommendations:None  Follow-up plan: ICM clinic phone appointment on11/29/2021. 91 day device clinic remote transmission11/15/2021.    EP/Cardiology Office Visits:Recall for 2/13/2022with Dr. Lovena Le.   Copy of ICM check sent to Dr.Taylor.   3 month ICM trend: 04/10/2020    1 Year ICM trend:       [name], RN 04/14/2020 12:27 PM

## 2020-05-01 ENCOUNTER — Ambulatory Visit (INDEPENDENT_AMBULATORY_CARE_PROVIDER_SITE_OTHER): Payer: 59

## 2020-05-01 DIAGNOSIS — I5022 Chronic systolic (congestive) heart failure: Secondary | ICD-10-CM

## 2020-05-01 DIAGNOSIS — I429 Cardiomyopathy, unspecified: Secondary | ICD-10-CM | POA: Diagnosis not present

## 2020-05-03 LAB — CUP PACEART REMOTE DEVICE CHECK
Battery Remaining Longevity: 47 mo
Battery Voltage: 2.93 V
Brady Statistic RV Percent Paced: 0 %
Date Time Interrogation Session: 20211116172605
HighPow Impedance: 47 Ohm
Implantable Lead Implant Date: 20150605
Implantable Lead Location: 753860
Implantable Pulse Generator Implant Date: 20150605
Lead Channel Impedance Value: 285 Ohm
Lead Channel Impedance Value: 342 Ohm
Lead Channel Pacing Threshold Amplitude: 0.625 V
Lead Channel Pacing Threshold Pulse Width: 0.4 ms
Lead Channel Sensing Intrinsic Amplitude: 12.5 mV
Lead Channel Sensing Intrinsic Amplitude: 12.5 mV
Lead Channel Setting Pacing Amplitude: 2.5 V
Lead Channel Setting Pacing Pulse Width: 0.4 ms
Lead Channel Setting Sensing Sensitivity: 0.3 mV

## 2020-05-04 NOTE — Progress Notes (Signed)
Remote ICD transmission.   

## 2020-05-19 ENCOUNTER — Telehealth: Payer: Self-pay

## 2020-05-19 NOTE — Telephone Encounter (Signed)
Pt sister agreed to help patient send missed transmission around 3:30 pm today.

## 2020-05-22 ENCOUNTER — Ambulatory Visit (INDEPENDENT_AMBULATORY_CARE_PROVIDER_SITE_OTHER): Payer: 59

## 2020-05-22 DIAGNOSIS — Z95810 Presence of automatic (implantable) cardiac defibrillator: Secondary | ICD-10-CM

## 2020-05-22 DIAGNOSIS — I5022 Chronic systolic (congestive) heart failure: Secondary | ICD-10-CM | POA: Diagnosis not present

## 2020-05-23 NOTE — Progress Notes (Signed)
EPIC Encounter for ICM Monitoring  Patient Name: [name] is a 74 y.o. female Date: 05/23/2020 Primary Care Physican: Patient, No Pcp Per Primary Cardiologist:Taylor Electrophysiologist: Lovena Le Last Weight:98lbs  Transmission reviewed.  Optivol thoracic impedancenormal.   Prescribed:No diuretic  Recommendations:None  Follow-up plan: ICM clinic phone appointment on1/03/2021. 91 day device clinic remote transmission2/14/2022.    EP/Cardiology Office Visits:Recall for 2/13/2022with Dr. Lovena Le.   Copy of ICM check sent to Dr.Taylor.   3 month ICM trend: 05/19/2020    1 Year ICM trend:       [name], RN 05/23/2020 12:34 PM

## 2020-06-26 ENCOUNTER — Ambulatory Visit (INDEPENDENT_AMBULATORY_CARE_PROVIDER_SITE_OTHER): Payer: 59

## 2020-06-26 DIAGNOSIS — I5022 Chronic systolic (congestive) heart failure: Secondary | ICD-10-CM | POA: Diagnosis not present

## 2020-06-26 DIAGNOSIS — Z95810 Presence of automatic (implantable) cardiac defibrillator: Secondary | ICD-10-CM

## 2020-06-28 NOTE — Progress Notes (Signed)
EPIC Encounter for ICM Monitoring  Patient Name: [name] is a 75 y.o. female Date: 06/28/2020 Primary Care Physican: Patient, No Pcp Per Primary Cardiologist:Taylor Electrophysiologist: Lovena Le Last Weight:98lbs  Transmission reviewed.  Optivol thoracic impedancenormal.   Prescribed:No diuretic  Recommendations:None  Follow-up plan: ICM clinic phone appointment on2/15/2022. 91 day device clinic remote transmission2/14/2022.    EP/Cardiology Office Visits:2/22/2022with Dr. Lovena Le.   Copy of ICM check sent to Dr.Taylor.   3 month ICM trend: 06/26/2020.    1 Year ICM trend:       [name], RN 06/28/2020 10:41 AM

## 2020-07-31 ENCOUNTER — Ambulatory Visit (INDEPENDENT_AMBULATORY_CARE_PROVIDER_SITE_OTHER): Payer: 59

## 2020-07-31 DIAGNOSIS — I255 Ischemic cardiomyopathy: Secondary | ICD-10-CM | POA: Diagnosis not present

## 2020-08-01 ENCOUNTER — Ambulatory Visit (INDEPENDENT_AMBULATORY_CARE_PROVIDER_SITE_OTHER): Payer: 59

## 2020-08-01 DIAGNOSIS — Z95810 Presence of automatic (implantable) cardiac defibrillator: Secondary | ICD-10-CM | POA: Diagnosis not present

## 2020-08-01 DIAGNOSIS — I5022 Chronic systolic (congestive) heart failure: Secondary | ICD-10-CM

## 2020-08-01 LAB — CUP PACEART REMOTE DEVICE CHECK
Battery Remaining Longevity: 39 mo
Battery Voltage: 2.95 V
Brady Statistic RV Percent Paced: 0.01 %
Date Time Interrogation Session: 20220215031806
HighPow Impedance: 48 Ohm
Implantable Lead Implant Date: 20150605
Implantable Lead Location: 753860
Implantable Pulse Generator Implant Date: 20150605
Lead Channel Impedance Value: 304 Ohm
Lead Channel Impedance Value: 399 Ohm
Lead Channel Pacing Threshold Amplitude: 0.5 V
Lead Channel Pacing Threshold Pulse Width: 0.4 ms
Lead Channel Sensing Intrinsic Amplitude: 15.75 mV
Lead Channel Sensing Intrinsic Amplitude: 15.75 mV
Lead Channel Setting Pacing Amplitude: 2.5 V
Lead Channel Setting Pacing Pulse Width: 0.4 ms
Lead Channel Setting Sensing Sensitivity: 0.3 mV

## 2020-08-03 NOTE — Progress Notes (Signed)
Remote ICD transmission.   

## 2020-08-08 ENCOUNTER — Ambulatory Visit (INDEPENDENT_AMBULATORY_CARE_PROVIDER_SITE_OTHER): Payer: 59 | Admitting: Internal Medicine

## 2020-08-08 ENCOUNTER — Other Ambulatory Visit: Payer: Self-pay

## 2020-08-08 ENCOUNTER — Encounter: Payer: Self-pay | Admitting: Internal Medicine

## 2020-08-08 VITALS — BP 134/76 | HR 83 | Ht 60.0 in | Wt 104.4 lb

## 2020-08-08 DIAGNOSIS — I5022 Chronic systolic (congestive) heart failure: Secondary | ICD-10-CM

## 2020-08-08 DIAGNOSIS — I255 Ischemic cardiomyopathy: Secondary | ICD-10-CM

## 2020-08-08 DIAGNOSIS — Z95810 Presence of automatic (implantable) cardiac defibrillator: Secondary | ICD-10-CM

## 2020-08-08 NOTE — Progress Notes (Signed)
HPI [name] returns today for followup. 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 The patient has done well in the interim. She denies chest pain or sob. She has now received the Covid vaccine. She has not had edema except in her right arm. Her weight is unchanged. Allergies  Allergen Reactions  . Beta Adrenergic Blockers Other (See Comments)    fatigue     Current Outpatient Medications  Medication Sig Dispense Refill  . alendronate (FOSAMAX) 70 MG tablet Take 1 tablet by mouth once a week.    Marland Kitchen amLODipine (NORVASC) 5 MG tablet Take 5 mg by mouth daily.  6  . carvedilol (COREG) 3.125 MG tablet Take 3.125 mg by mouth 2 (two) times daily.  0  . Cholecalciferol (VITAMIN D) 2000 UNITS CAPS Take 1 capsule by mouth daily.    . clopidogrel (PLAVIX) 75 MG tablet Take 75 mg by mouth daily.    Marland Kitchen ENSURE (ENSURE) Take 237 mLs by mouth daily.     . rosuvastatin (CRESTOR) 20 MG tablet Take 20 mg by mouth daily.    Marland Kitchen tetrahydrozoline 0.05 % ophthalmic solution Place 1 drop into both eyes 2 (two) times daily.     No current facility-administered medications for this visit.     Past Medical History:  Diagnosis Date  . 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 Chronic systolic CHF (congestive heart failure), NYHA class 2 ([place])    a. Dx 05/2005;  b. 10/2013 Echo: EF 25-30%, DK and scarring of mid-distal antsept, inf, infsept, & apical myocardium. Severe HK of anterior wall. Gr 1 DD.  Marland Kitchen CVA (cerebral vascular accident) [facility]) 2002, 2008, "mini-stroke"  Oct 2016   Dr. Rob Hickman; "LUE very weak; left leg a little weak but can still walk"   . Deafness in left ear   . Depression   . Dyslipidemia   . Hypertension   . Ischemic cardiomyopathy December 2006   a. 10/2013 Echo: EF 25-30%;  b. 11/2013 MDT single Lead ICD gen change and lead revision.  Marland Kitchen Uterine fibroid     ROS:    All systems reviewed and negative except as noted in the HPI.   Past Surgical History:  Procedure Laterality Date  . ABDOMINAL HYSTERECTOMY     "partial"  . APPENDECTOMY    . CARDIAC DEFIBRILLATOR PLACEMENT  05/2005; 11/19/2013   MDT ICD implanted 2006 by Dr Lovena Le with [phone] lead; gen change and RV lead revision 11/2013 by Dr Lovena Le  . CHOLECYSTECTOMY    . IMPLANTABLE CARDIOVERTER DEFIBRILLATOR REVISION N/A 11/19/2013   Procedure: IMPLANTABLE CARDIOVERTER DEFIBRILLATOR REVISION;  Surgeon: Evans Lance, MD;  Location: [facility] CATH LAB;  Service: Cardiovascular;  Laterality: N/A;  . TONSILLECTOMY       Family History  Problem Relation Age of Onset  . Heart disease Mother 22       Cerebrovascular Accident  . Mental retardation Sister      Social History   Socioeconomic History  . Marital status: Legally Separated    Spouse name: Not on file  . Number of children: Not on file  . Years of education: Not on file  . Highest education level: Not on file  Occupational History  . Occupation: disabled  Tobacco Use  . Smoking status: Former Smoker    Packs/day: 0.10    Years: 42.00  Pack years: 4.20    Types: Cigarettes    Quit date: 06/18/2007    Years since quitting: 13.1  . Smokeless tobacco: Never Used  Vaping Use  . Vaping Use: Never used  Substance and Sexual Activity  . Alcohol use: No    Alcohol/week: 0.0 standard drinks    Comment: "stopped drinking alcohol in the 1990's"  . Drug use: No    Types: Cocaine    Comment: "stopped using drugs "before 2009"  . Sexual activity: Never  Other Topics Concern  . Not on file  Social History Narrative  . Not on file   Social Determinants of Health   Financial Resource Strain: Not on file  Food Insecurity: Not on file  Transportation Needs: Not on file  Physical Activity: Not on file  Stress: Not on file  Social Connections: Not on file  Intimate Partner Violence: Not on file     BP 134/76   Pulse 83   Ht 5' (1.524 m)    Wt 104 lb 6.4 oz (47.4 kg)   SpO2 95%   BMI 20.39 kg/m   Physical Exam:  Well appearing NAD HEENT: Unremarkable Neck:  No JVD, no thyromegally Lymphatics:  No adenopathy Back:  No CVA tenderness Lungs:  Clear HEART:  Regular rate rhythm, no murmurs, no rubs, no clicks Abd:  soft, positive bowel sounds, no organomegally, no rebound, no guarding Ext:  2 plus pulses, no edema, no cyanosis, no clubbing Skin:  No rashes no nodules Neuro:  CN II through XII intact, motor grossly intact  EKG - nsr with ASMI  DEVICE  Normal device function.  See PaceArt for details.   Assess/Plan: 1. Chronic systolic heart failure - Her symptoms are class 2. She will continue her current meds. 2. Right arm swelling - she has had an u/s and this did not show a clot though that does not rule out a DVT. I encouraged her to leave her right arm elevated. 3. ICD - her medtronic single chamber ICD is working normally.  4. ICM - she is s/p remote MI, likely from embolism. She denies anginal symptoms.  [name] ,MD

## 2020-08-08 NOTE — Patient Instructions (Signed)
Medication Instructions:  Your physician recommends that you continue on your current medications as directed. Please refer to the Current Medication list given to you today.  Labwork: None ordered.  Testing/Procedures: None ordered.  Follow-Up: Your physician wants you to follow-up in: one year with Cristopher Peru, MD or one of the following Advanced Practice Providers on your designated Care Team:    Chanetta Marshall, NP  Tommye Standard, PA-C  Legrand Como "Jonni Sanger" [place], [place]  Remote monitoring is used to monitor your ICD from home. This monitoring reduces the number of office visits required to check your device to one time per year. It allows [place] to keep an eye on the functioning of your device to ensure it is working properly. You are scheduled for a device check from home on 10/30/2020. You may send your transmission at any time that day. If you have a wireless device, the transmission will be sent automatically. After your physician reviews your transmission, you will receive a postcard with your next transmission date.  Any Other Special Instructions Will Be Listed Below (If Applicable).  If you need a refill on your cardiac medications before your next appointment, please call your pharmacy.

## 2020-08-10 NOTE — Addendum Note (Signed)
Addended by: Rose Phi on: 08/10/2020 08:43 AM   Modules accepted: Orders

## 2020-08-11 NOTE — Progress Notes (Signed)
EPIC Encounter for ICM Monitoring  Patient Name: [name] is a 75 y.o. female Date: 08/11/2020 Primary Care Physican: Patient, No Pcp Per Primary Cardiologist:Taylor Electrophysiologist: Lovena Le 08/08/2020 Office Weight:104lbs  Transmission reviewed.  Optivol thoracic impedancenormal.   Prescribed:No diuretic  Recommendations:None  Follow-up plan: ICM clinic phone appointment on3/28/2022. 91 day device clinic remote transmission5/16/2022.    EP/Cardiology Office Visits:  Recall2/18/2023with Dr. Lovena Le.   Copy of ICM check sent to Dr.Taylor.   3 month ICM trend: 08/01/2020.    1 Year ICM trend:       [name], RN 08/11/2020 9:35 AM

## 2020-09-11 ENCOUNTER — Ambulatory Visit (INDEPENDENT_AMBULATORY_CARE_PROVIDER_SITE_OTHER): Payer: 59

## 2020-09-11 DIAGNOSIS — I5022 Chronic systolic (congestive) heart failure: Secondary | ICD-10-CM | POA: Diagnosis not present

## 2020-09-11 DIAGNOSIS — Z95810 Presence of automatic (implantable) cardiac defibrillator: Secondary | ICD-10-CM | POA: Diagnosis not present

## 2020-09-13 NOTE — Progress Notes (Signed)
EPIC Encounter for ICM Monitoring  Patient Name: [name] is a 75 y.o. female Date: 09/13/2020 Primary Care Physican: Patient, No Pcp Per (Inactive) Primary Cardiologist:Taylor Electrophysiologist: Lovena Le 08/08/2020 Office Weight:104lbs  Transmission reviewed.  Optivol thoracic impedancenormal.   Prescribed:No diuretic  Recommendations:None  Follow-up plan: ICM clinic phone appointment on5/07/2020. 91 day device clinic remote transmission 10/30/2020.    EP/Cardiology Office Visits:Recall 2/18/2023with Dr. Lovena Le.   Copy of ICM check sent to Dr.Taylor  3 month ICM trend: 09/11/2020.    1 Year ICM trend:       Tiffany Billings, RN 09/13/2020 9:08 AM

## 2020-10-16 ENCOUNTER — Ambulatory Visit (INDEPENDENT_AMBULATORY_CARE_PROVIDER_SITE_OTHER): Payer: 59

## 2020-10-16 DIAGNOSIS — Z95810 Presence of automatic (implantable) cardiac defibrillator: Secondary | ICD-10-CM | POA: Diagnosis not present

## 2020-10-16 DIAGNOSIS — I5022 Chronic systolic (congestive) heart failure: Secondary | ICD-10-CM | POA: Diagnosis not present

## 2020-10-18 NOTE — Progress Notes (Signed)
EPIC Encounter for ICM Monitoring  Patient Name: [name] is a 75 y.o. female Date: 10/18/2020 Primary Care Physican: Patient, No Pcp Per (Inactive) Primary Cardiologist:Taylor Electrophysiologist: Lovena Le 08/30/2020 Office Weight:105lbs  Transmission reviewed.  Optivol thoracic impedancenormal.   Prescribed:No diuretic  Recommendations:None  Follow-up plan: ICM clinic phone appointment on6/13/2022. 91 day device clinic remote transmission 10/30/2020.    EP/Cardiology Office Visits:Recall 2/18/2023with Dr. Lovena Le.   Copy of ICM check sent to Dr.Taylor  3 month ICM trend: 10/16/2020.    1 Year ICM trend:       [name], RN 10/18/2020 10:56 AM

## 2020-10-30 ENCOUNTER — Ambulatory Visit (INDEPENDENT_AMBULATORY_CARE_PROVIDER_SITE_OTHER): Payer: 59

## 2020-10-30 DIAGNOSIS — I255 Ischemic cardiomyopathy: Secondary | ICD-10-CM | POA: Diagnosis not present

## 2020-10-31 LAB — CUP PACEART REMOTE DEVICE CHECK
Battery Remaining Longevity: 36 mo
Battery Voltage: 2.97 V
Brady Statistic RV Percent Paced: 0.01 %
Date Time Interrogation Session: 20220516033431
HighPow Impedance: 41 Ohm
Implantable Lead Implant Date: 20150605
Implantable Lead Location: 753860
Implantable Pulse Generator Implant Date: 20150605
Lead Channel Impedance Value: 285 Ohm
Lead Channel Impedance Value: 361 Ohm
Lead Channel Pacing Threshold Amplitude: 0.625 V
Lead Channel Pacing Threshold Pulse Width: 0.4 ms
Lead Channel Sensing Intrinsic Amplitude: 8.25 mV
Lead Channel Sensing Intrinsic Amplitude: 8.25 mV
Lead Channel Setting Pacing Amplitude: 2.5 V
Lead Channel Setting Pacing Pulse Width: 0.4 ms
Lead Channel Setting Sensing Sensitivity: 0.3 mV

## 2020-11-21 NOTE — Progress Notes (Signed)
Remote ICD transmission.   

## 2020-11-27 ENCOUNTER — Ambulatory Visit (INDEPENDENT_AMBULATORY_CARE_PROVIDER_SITE_OTHER): Payer: 59

## 2020-11-27 DIAGNOSIS — I5022 Chronic systolic (congestive) heart failure: Secondary | ICD-10-CM

## 2020-11-27 DIAGNOSIS — Z95810 Presence of automatic (implantable) cardiac defibrillator: Secondary | ICD-10-CM | POA: Diagnosis not present

## 2020-11-28 NOTE — Progress Notes (Signed)
EPIC Encounter for ICM Monitoring  Patient Name: [name] is a 75 y.o. female Date: 11/28/2020 Primary Care Physican: Patient, No Pcp Per (Inactive) Primary Cardiologist: Lovena Le Electrophysiologist: Lovena Le 08/30/2020 Office Weight: 105 lbs    Transmission reviewed.   Optivol thoracic impedance normal.   Prescribed: No diuretic   Recommendations:  None   Follow-up plan: ICM clinic phone appointment on 01/01/2021.   91 day device clinic remote transmission 01/29/2021.         EP/Cardiology Office Visits: Recall 08/04/2021 with Dr. Lovena Le.     Copy of ICM check sent to Dr. Lovena Le  3 month ICM trend: 11/27/2020.    1 Year ICM trend:       [name], RN 11/28/2020 3:50 PM

## 2021-01-01 ENCOUNTER — Ambulatory Visit (INDEPENDENT_AMBULATORY_CARE_PROVIDER_SITE_OTHER): Payer: 59

## 2021-01-01 DIAGNOSIS — Z95810 Presence of automatic (implantable) cardiac defibrillator: Secondary | ICD-10-CM | POA: Diagnosis not present

## 2021-01-01 DIAGNOSIS — I5022 Chronic systolic (congestive) heart failure: Secondary | ICD-10-CM

## 2021-01-02 NOTE — Progress Notes (Signed)
EPIC Encounter for ICM Monitoring  Patient Name: [name] is a 75 y.o. female Date: 01/02/2021 Primary Care Physican: Patient, No Pcp Per (Inactive) Primary Cardiologist: Lovena Le Electrophysiologist: Lovena Le 08/30/2020 Office Weight: 105 lbs    Transmission reviewed.   Optivol thoracic impedance normal.   Prescribed: No diuretic   Recommendations:  None   Follow-up plan: ICM clinic phone appointment on 02/05/2021.   91 day device clinic remote transmission 01/29/2021.         EP/Cardiology Office Visits: Recall 08/04/2021 with Dr. Lovena Le.     Copy of ICM check sent to Dr. Lovena Le.   3 month ICM trend: 01/01/2021.    1 Year ICM trend:       [name], RN 01/02/2021 4:06 PM

## 2021-01-29 ENCOUNTER — Ambulatory Visit (INDEPENDENT_AMBULATORY_CARE_PROVIDER_SITE_OTHER): Payer: 59

## 2021-01-29 DIAGNOSIS — I255 Ischemic cardiomyopathy: Secondary | ICD-10-CM

## 2021-01-31 LAB — CUP PACEART REMOTE DEVICE CHECK
Battery Remaining Longevity: 36 mo
Battery Voltage: 2.96 V
Brady Statistic RV Percent Paced: 0.01 %
Date Time Interrogation Session: 20220815123526
HighPow Impedance: 48 Ohm
Implantable Lead Implant Date: 20150605
Implantable Lead Location: 753860
Implantable Pulse Generator Implant Date: 20150605
Lead Channel Impedance Value: 285 Ohm
Lead Channel Impedance Value: 361 Ohm
Lead Channel Pacing Threshold Amplitude: 0.625 V
Lead Channel Pacing Threshold Pulse Width: 0.4 ms
Lead Channel Sensing Intrinsic Amplitude: 5.375 mV
Lead Channel Sensing Intrinsic Amplitude: 5.375 mV
Lead Channel Setting Pacing Amplitude: 2.5 V
Lead Channel Setting Pacing Pulse Width: 0.4 ms
Lead Channel Setting Sensing Sensitivity: 0.3 mV

## 2021-02-05 ENCOUNTER — Ambulatory Visit (INDEPENDENT_AMBULATORY_CARE_PROVIDER_SITE_OTHER): Payer: 59

## 2021-02-05 DIAGNOSIS — I5022 Chronic systolic (congestive) heart failure: Secondary | ICD-10-CM

## 2021-02-05 DIAGNOSIS — Z95810 Presence of automatic (implantable) cardiac defibrillator: Secondary | ICD-10-CM

## 2021-02-06 NOTE — Progress Notes (Signed)
EPIC Encounter for ICM Monitoring  Patient Name: [name] is a 75 y.o. female Date: 02/06/2021 Primary Care Physican: Patient, No Pcp Per (Inactive) Primary Cardiologist: Lovena Le Electrophysiologist: Lovena Le 08/30/2020 Office Weight: 105 lbs    Transmission reviewed.   Optivol thoracic impedance normal.   Prescribed: No diuretic   Recommendations:  None   Follow-up plan: ICM clinic phone appointment on 03/19/2021.   91 day device clinic remote transmission 05/03/2021.         EP/Cardiology Office Visits: Recall 08/04/2021 with Dr. Lovena Le.     Copy of ICM check sent to Dr. Lovena Le.    3 month ICM trend: 02/06/2021.    1 Year ICM trend:       [name], RN 02/06/2021 3:50 PM

## 2021-02-16 NOTE — Progress Notes (Signed)
Remote ICD transmission.   

## 2021-03-05 ENCOUNTER — Telehealth: Payer: Self-pay | Admitting: Internal Medicine

## 2021-03-05 NOTE — Telephone Encounter (Signed)
Successful telephone encounter to patient's sister [name] (on DPR) to inform that a plug-in bug deterrent should not interferer with patients defibrillator function. Sister appreciative of call.

## 2021-03-05 NOTE — Telephone Encounter (Signed)
New message   Pt sister called and asked about a bug light that has a blinking red light and if it would effect ICD

## 2021-03-19 ENCOUNTER — Ambulatory Visit (INDEPENDENT_AMBULATORY_CARE_PROVIDER_SITE_OTHER): Payer: 59

## 2021-03-19 DIAGNOSIS — Z95810 Presence of automatic (implantable) cardiac defibrillator: Secondary | ICD-10-CM | POA: Diagnosis not present

## 2021-03-19 DIAGNOSIS — I5022 Chronic systolic (congestive) heart failure: Secondary | ICD-10-CM

## 2021-03-21 NOTE — Progress Notes (Signed)
EPIC Encounter for ICM Monitoring  Patient Name: [name] is a 75 y.o. female Date: 03/21/2021 Primary Care Physican: Patient, No Pcp Per (Inactive) Primary Cardiologist: Lovena Le Electrophysiologist: Lovena Le 08/30/2020 Office Weight: 105 lbs    Transmission reviewed.   Optivol thoracic impedance normal.   Prescribed: No diuretic   Recommendations:  None   Follow-up plan: ICM clinic phone appointment on 04/23/2021.   91 day device clinic remote transmission 04/30/2021.         EP/Cardiology Office Visits: Recall 08/04/2021 with Dr. Lovena Le.     Copy of ICM check sent to Dr. Lovena Le.     3 month ICM trend: 03/19/2021.    1 Year ICM trend:       [name], RN 03/21/2021 4:46 PM

## 2021-04-23 ENCOUNTER — Ambulatory Visit (INDEPENDENT_AMBULATORY_CARE_PROVIDER_SITE_OTHER): Payer: 59

## 2021-04-23 DIAGNOSIS — Z95810 Presence of automatic (implantable) cardiac defibrillator: Secondary | ICD-10-CM | POA: Diagnosis not present

## 2021-04-23 DIAGNOSIS — I5022 Chronic systolic (congestive) heart failure: Secondary | ICD-10-CM | POA: Diagnosis not present

## 2021-04-24 NOTE — Progress Notes (Signed)
EPIC Encounter for ICM Monitoring  Patient Name: [name] is a 75 y.o. female Date: 04/24/2021 Primary Care Physican: Patient, No Pcp Per (Inactive) Primary Cardiologist: Lovena Le Electrophysiologist: Lovena Le Weight: unknown   Transmission reviewed.   Optivol thoracic impedance normal.   Prescribed: No diuretic   Recommendations:  None   Follow-up plan: ICM clinic phone appointment on 05/28/2021.   91 day device clinic remote transmission 04/30/2021.         EP/Cardiology Office Visits: Recall 08/04/2021 with Dr. Lovena Le.     Copy of ICM check sent to Dr. Lovena Le.       3 month ICM trend: 04/23/2021.    1 Year ICM trend:       [name], RN 04/24/2021 1:06 PM

## 2021-04-30 ENCOUNTER — Ambulatory Visit (INDEPENDENT_AMBULATORY_CARE_PROVIDER_SITE_OTHER): Payer: 59

## 2021-04-30 DIAGNOSIS — I255 Ischemic cardiomyopathy: Secondary | ICD-10-CM

## 2021-04-30 LAB — CUP PACEART REMOTE DEVICE CHECK
Battery Remaining Longevity: 39 mo
Battery Voltage: 2.95 V
Brady Statistic RV Percent Paced: 0.01 %
Date Time Interrogation Session: 20221114112608
HighPow Impedance: 49 Ohm
Implantable Lead Implant Date: 20150605
Implantable Lead Location: 753860
Implantable Pulse Generator Implant Date: 20150605
Lead Channel Impedance Value: 285 Ohm
Lead Channel Impedance Value: 342 Ohm
Lead Channel Pacing Threshold Amplitude: 0.75 V
Lead Channel Pacing Threshold Pulse Width: 0.4 ms
Lead Channel Sensing Intrinsic Amplitude: 5.25 mV
Lead Channel Sensing Intrinsic Amplitude: 5.25 mV
Lead Channel Setting Pacing Amplitude: 2.5 V
Lead Channel Setting Pacing Pulse Width: 0.4 ms
Lead Channel Setting Sensing Sensitivity: 0.3 mV

## 2021-05-07 NOTE — Progress Notes (Signed)
Remote ICD transmission.   

## 2021-05-28 ENCOUNTER — Ambulatory Visit (INDEPENDENT_AMBULATORY_CARE_PROVIDER_SITE_OTHER): Payer: 59

## 2021-05-28 DIAGNOSIS — Z95810 Presence of automatic (implantable) cardiac defibrillator: Secondary | ICD-10-CM | POA: Diagnosis not present

## 2021-05-28 DIAGNOSIS — I5022 Chronic systolic (congestive) heart failure: Secondary | ICD-10-CM | POA: Diagnosis not present

## 2021-06-04 NOTE — Progress Notes (Signed)
EPIC Encounter for ICM Monitoring  Patient Name: [name] is a 75 y.o. female Date: 06/04/2021 Primary Care Physican: Patient, No Pcp Per (Inactive) Primary Cardiologist: Lovena Le Electrophysiologist: Lovena Le Weight: unknown   Transmission reviewed.   Optivol thoracic impedance normal.   Prescribed: No diuretic   Recommendations:  None   Follow-up plan: ICM clinic phone appointment on 07/02/2021.   91 day device clinic remote transmission 07/30/2021.         EP/Cardiology Office Visits: 08/16/2021 with Dr. Lovena Le.     Copy of ICM check sent to Dr. Lovena Le.     3 month ICM trend: 05/28/2021.    12-14 Month ICM trend:       [name], RN 06/04/2021 5:11 PM

## 2021-07-02 ENCOUNTER — Ambulatory Visit (INDEPENDENT_AMBULATORY_CARE_PROVIDER_SITE_OTHER): Payer: 59

## 2021-07-02 DIAGNOSIS — Z95810 Presence of automatic (implantable) cardiac defibrillator: Secondary | ICD-10-CM | POA: Diagnosis not present

## 2021-07-02 DIAGNOSIS — I5022 Chronic systolic (congestive) heart failure: Secondary | ICD-10-CM

## 2021-07-04 NOTE — Progress Notes (Signed)
EPIC Encounter for ICM Monitoring  Patient Name: [name] is a 76 y.o. female Date: 07/04/2021 Primary Care Physican: Patient, No Pcp Per (Inactive) Primary Cardiologist: Lovena Le Electrophysiologist: Lovena Le Weight: unknown   Transmission reviewed.   Optivol thoracic impedance normal.   Prescribed: No diuretic   Recommendations:  None   Follow-up plan: ICM clinic phone appointment on 08/06/2021.   91 day device clinic remote transmission 07/30/2021.         EP/Cardiology Office Visits: 08/16/2021 with Dr. Lovena Le.     Copy of ICM check sent to Dr. Lovena Le.      3 month ICM trend: 07/02/2021.    12-14 Month ICM trend:     [name], RN 07/04/2021 5:10 PM

## 2021-07-30 ENCOUNTER — Ambulatory Visit (INDEPENDENT_AMBULATORY_CARE_PROVIDER_SITE_OTHER): Payer: 59

## 2021-07-30 DIAGNOSIS — I255 Ischemic cardiomyopathy: Secondary | ICD-10-CM | POA: Diagnosis not present

## 2021-07-31 LAB — CUP PACEART REMOTE DEVICE CHECK
Battery Remaining Longevity: 31 mo
Battery Voltage: 2.95 V
Brady Statistic RV Percent Paced: 0.01 %
Date Time Interrogation Session: 20230213132316
HighPow Impedance: 49 Ohm
Implantable Lead Implant Date: 20150605
Implantable Lead Location: 753860
Implantable Pulse Generator Implant Date: 20150605
Lead Channel Impedance Value: 304 Ohm
Lead Channel Impedance Value: 361 Ohm
Lead Channel Pacing Threshold Amplitude: 0.75 V
Lead Channel Pacing Threshold Pulse Width: 0.4 ms
Lead Channel Sensing Intrinsic Amplitude: 7.5 mV
Lead Channel Sensing Intrinsic Amplitude: 7.5 mV
Lead Channel Setting Pacing Amplitude: 2.5 V
Lead Channel Setting Pacing Pulse Width: 0.4 ms
Lead Channel Setting Sensing Sensitivity: 0.3 mV

## 2021-08-01 NOTE — Progress Notes (Signed)
Remote ICD transmission.   

## 2021-08-06 ENCOUNTER — Ambulatory Visit (INDEPENDENT_AMBULATORY_CARE_PROVIDER_SITE_OTHER): Payer: 59

## 2021-08-06 DIAGNOSIS — I5022 Chronic systolic (congestive) heart failure: Secondary | ICD-10-CM

## 2021-08-06 DIAGNOSIS — Z95810 Presence of automatic (implantable) cardiac defibrillator: Secondary | ICD-10-CM | POA: Diagnosis not present

## 2021-08-08 NOTE — Progress Notes (Signed)
EPIC Encounter for ICM Monitoring  Patient Name: [name] is a 76 y.o. female Date: 08/08/2021 Primary Care Physican: Patient, No Pcp Per (Inactive) Primary Cardiologist: Lovena Le Electrophysiologist: Lovena Le Weight: unknown   Transmission reviewed.   Optivol thoracic impedance trending sightly below baseline normal.   Prescribed: No diuretic   Recommendations:  None   Follow-up plan: ICM clinic phone appointment on 09/10/2021 (fluid levels will be rechecked on 3/2 OV).   91 day device clinic remote transmission 10/29/2021.         EP/Cardiology Office Visits: 08/16/2021 with Dr. Lovena Le.     Copy of ICM check sent to Dr. Lovena Le.     3 month ICM trend: 08/06/2021.    12-14 Month ICM trend:     [name], RN 08/08/2021 1:39 PM

## 2021-08-16 ENCOUNTER — Other Ambulatory Visit: Payer: Self-pay

## 2021-08-16 ENCOUNTER — Encounter: Payer: Self-pay | Admitting: Internal Medicine

## 2021-08-16 ENCOUNTER — Ambulatory Visit (INDEPENDENT_AMBULATORY_CARE_PROVIDER_SITE_OTHER): Payer: 59 | Admitting: Internal Medicine

## 2021-08-16 VITALS — BP 112/62 | HR 66 | Ht 60.0 in | Wt 102.0 lb

## 2021-08-16 DIAGNOSIS — I255 Ischemic cardiomyopathy: Secondary | ICD-10-CM | POA: Diagnosis not present

## 2021-08-16 DIAGNOSIS — Z95810 Presence of automatic (implantable) cardiac defibrillator: Secondary | ICD-10-CM

## 2021-08-16 DIAGNOSIS — I5022 Chronic systolic (congestive) heart failure: Secondary | ICD-10-CM | POA: Diagnosis not present

## 2021-08-16 LAB — CUP PACEART INCLINIC DEVICE CHECK
Battery Remaining Longevity: 37 mo
Battery Voltage: 2.95 V
Brady Statistic RV Percent Paced: 0.01 %
Date Time Interrogation Session: 20230302164724
HighPow Impedance: 46 Ohm
Implantable Lead Implant Date: 20150605
Implantable Lead Location: 753860
Implantable Pulse Generator Implant Date: 20150605
Lead Channel Impedance Value: 285 Ohm
Lead Channel Impedance Value: 342 Ohm
Lead Channel Pacing Threshold Amplitude: 0.75 V
Lead Channel Pacing Threshold Pulse Width: 0.4 ms
Lead Channel Sensing Intrinsic Amplitude: 10.625 mV
Lead Channel Sensing Intrinsic Amplitude: 12.375 mV
Lead Channel Setting Pacing Amplitude: 2.5 V
Lead Channel Setting Pacing Pulse Width: 0.4 ms
Lead Channel Setting Sensing Sensitivity: 0.3 mV

## 2021-08-16 NOTE — Progress Notes (Signed)
? ? ? ? ?HPI ?[name] returns today for followup. She is a pleasnat 76 yo woman with an ICM, s/p remote MI, class 2 CHF, prior stroke with left HP, s/p ICD insertion. She c/o 3-4 days of right arm swelling. She denies any trauma. No chest pain or sob. No edema in the legs.  ?Allergies  ?Allergen Reactions  ? Beta Adrenergic Blockers Other (See Comments)  ?  fatigue  ? ? ? ?Current Outpatient Medications  ?Medication Sig Dispense Refill  ? alendronate (FOSAMAX) 70 MG tablet Take 1 tablet by mouth once a week.    ? amLODipine (NORVASC) 5 MG tablet Take 5 mg by mouth daily.  6  ? carvedilol (COREG) 3.125 MG tablet Take 3.125 mg by mouth 2 (two) times daily.  0  ? Cholecalciferol (VITAMIN D) 2000 UNITS CAPS Take 1 capsule by mouth daily.    ? clopidogrel (PLAVIX) 75 MG tablet Take 75 mg by mouth daily.    ? ENSURE (ENSURE) Take 237 mLs by mouth daily.     ? rosuvastatin (CRESTOR) 20 MG tablet Take 20 mg by mouth daily.    ? tetrahydrozoline 0.05 % ophthalmic solution Place 1 drop into both eyes 2 (two) times daily.    ? ?No current facility-administered medications for this visit.  ? ? ? ?Past Medical History:  ?Diagnosis Date  ? 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 Chronic systolic CHF (congestive heart failure), NYHA class 2 ([place])   ? a. Dx 05/2005;  b. 10/2013 Echo: EF 25-30%, DK and scarring of mid-distal antsept, inf, infsept, & apical myocardium. Severe HK of anterior wall. Gr 1 DD.  ? CVA (cerebral vascular accident) [facility]) 2002, 2008, "mini-stroke"  Oct 2016  ? Dr. Rob Hickman; "LUE very weak; left leg a little weak but can still walk"   ? Deafness in left ear   ? Depression   ? Dyslipidemia   ? Hypertension   ? Ischemic cardiomyopathy December 2006  ? a. 10/2013 Echo: EF 25-30%;  b. 11/2013 MDT single Lead ICD gen change and lead revision.  ? Uterine fibroid   ? ? ?ROS: ? ? All systems reviewed and negative except as noted in the  HPI. ? ? ?Past Surgical History:  ?Procedure Laterality Date  ? ABDOMINAL HYSTERECTOMY    ? "partial"  ? APPENDECTOMY    ? CARDIAC DEFIBRILLATOR PLACEMENT  05/2005; 11/19/2013  ? MDT ICD implanted 2006 by Dr Lovena Le with [phone] lead; gen change and RV lead revision 11/2013 by Dr Lovena Le  ? CHOLECYSTECTOMY    ? IMPLANTABLE CARDIOVERTER DEFIBRILLATOR REVISION N/A 11/19/2013  ? Procedure: IMPLANTABLE CARDIOVERTER DEFIBRILLATOR REVISION;  Surgeon: Evans Lance, MD;  Location: [facility] CATH LAB;  Service: Cardiovascular;  Laterality: N/A;  ? TONSILLECTOMY    ? ? ? ?Family History  ?Problem Relation Age of Onset  ? Heart disease Mother 46  ?     Cerebrovascular Accident  ? Mental retardation Sister   ? ? ? ?Social History  ? ?Socioeconomic History  ? Marital status: Legally Separated  ?  Spouse name: Not on file  ? Number of children: Not on file  ? Years of education: Not on file  ? Highest education level: Not on file  ?Occupational History  ? Occupation: disabled  ?Tobacco Use  ? Smoking status: Former  ?  Packs/day: 0.10  ?  Years: 42.00  ?  Pack years: 4.20  ?  Types: Cigarettes  ?  Quit date: 06/18/2007  ?  Years since quitting: 14.1  ? Smokeless tobacco: Never  ?Vaping Use  ? Vaping Use: Never used  ?Substance and Sexual Activity  ? Alcohol use: No  ?  Alcohol/week: 0.0 standard drinks  ?  Comment: "stopped drinking alcohol in the 1990's"  ? Drug use: No  ?  Types: Cocaine  ?  Comment: "stopped using drugs "before 2009"  ? Sexual activity: Never  ?Other Topics Concern  ? Not on file  ?Social History Narrative  ? Not on file  ? ?Social Determinants of Health  ? ?Financial Resource Strain: Not on file  ?Food Insecurity: Not on file  ?Transportation Needs: Not on file  ?Physical Activity: Not on file  ?Stress: Not on file  ?Social Connections: Not on file  ?Intimate Partner Violence: Not on file  ? ? ? ?BP 112/62   Pulse 66   Ht 5' (1.524 m)   Wt 102 lb (46.3 kg)   SpO2 98%   BMI 19.92 kg/m?  ? ?Physical Exam: ? ?Well appearing  NAD ?HEENT: Unremarkable ?Neck:  No JVD, no thyromegally ?Lymphatics:  No adenopathy ?Back:  No CVA tenderness ?Lungs:  Clear with no wheezes ?HEART:  Regular rate rhythm, no murmurs, no rubs, no clicks ?Abd:  soft, positive bowel sounds, no organomegally, no rebound, no guarding ?Ext:  2 plus pulses, no edema, no cyanosis, no clubbing; right arm is swollen up to the lower biceps ?Skin:  No rashes no nodules ?Neuro:  CN II through XII intact, motor grossly intact ? ?EKG - NSR with AS MI ? ?DEVICE  ?Normal device function.  See PaceArt for details.  ? ?Assess/Plan:  ?Right arm swelling - I suspect that she has a clot in her right arm. I have asked her to undergo u/s in our [place] office but she lives in [place]. We tried to schedule an outpatient u/s but nothing available for a month. She will go to the ED in [place] to be evaluated tomorrow. I asked the patient to keep her arm elevated.  ?ICM - she denies anginal symptoms.  No change in meds. ?ICD - her medtronic ICD is working normally. ?Chronic systolic heart failure - her symptoms are class 2. Continue GDMT. ? ?Tiffany Overlie Fredrika Canby,MD ?

## 2021-08-16 NOTE — Patient Instructions (Addendum)
Medication Instructions:  ?Your physician recommends that you continue on your current medications as directed. Please refer to the Current Medication list given to you today. ? ?Labwork: ?None ordered. ? ?Testing/Procedures: ? ?Please get an ultrasound of your arm to rule out a blood clot ? ?Follow-Up: ?Your physician wants you to follow-up in: 3 months with Tiffany Peru, MD  ? ?Remote monitoring is used to monitor your ICD from home. This monitoring reduces the number of office visits required to check your device to one time per year. It allows [place] to keep an eye on the functioning of your device to ensure it is working properly. You are scheduled for a device check from home on 09/10/2021. You may send your transmission at any time that day. If you have a wireless device, the transmission will be sent automatically. After your physician reviews your transmission, you will receive a postcard with your next transmission date. ? ?Any Other Special Instructions Will Be Listed Below (If Applicable). ? ?If you need a refill on your cardiac medications before your next appointment, please call your pharmacy.  ? ? ? ? ?

## 2021-08-20 ENCOUNTER — Telehealth: Payer: Self-pay | Admitting: Internal Medicine

## 2021-08-20 NOTE — Telephone Encounter (Signed)
Patient's sister is calling stating that the patient had the ultrasound of her arm Dr. Lovena Le recommend in [place] and she has no blood clots. Patient's arm is still swollen the same is wanting to know what to do from here.  ?

## 2021-08-21 NOTE — Telephone Encounter (Signed)
Returned call to Pt's sister. ? ?Advised per Dr. Lovena Le:  keep arm elevated, low salt diet, see PCP. ? ?Per sister, Pt stated that her arm was painful yesterday. ? ?Advised to call PCP and see if Pt can be evaluated sooner than upcoming appt scheduled for September 13, 2021. ? ?Sister indicates understanding and will contact PCP now. ? ?Advised to call office if any assistance needed. ?

## 2021-09-10 ENCOUNTER — Ambulatory Visit (INDEPENDENT_AMBULATORY_CARE_PROVIDER_SITE_OTHER): Payer: 59

## 2021-09-10 DIAGNOSIS — Z95810 Presence of automatic (implantable) cardiac defibrillator: Secondary | ICD-10-CM | POA: Diagnosis not present

## 2021-09-10 DIAGNOSIS — I5022 Chronic systolic (congestive) heart failure: Secondary | ICD-10-CM | POA: Diagnosis not present

## 2021-09-14 NOTE — Progress Notes (Signed)
EPIC Encounter for ICM Monitoring ? ?Patient Name: [name] is a 76 y.o. female ?Date: 09/14/2021 ?Primary Care Physican: Patient, No Pcp Per (Inactive) ?Primary Cardiologist: Lovena Le ?Electrophysiologist: Lovena Le ?08/16/2021 Office Weight: 102 lbs ?  ?Transmission reviewed. ?  ?Optivol thoracic impedance suggesting normal fluid levels. ?  ?Prescribed: No diuretic ?  ?Recommendations:  None ?  ?Follow-up plan: ICM clinic phone appointment on 10/15/2021.   91 day device clinic remote transmission 10/29/2021.       ?  ?EP/Cardiology Office Visits: 12/14/2021 with Dr. Lovena Le.   ?  ?Copy of ICM check sent to Dr. Lovena Le.    ? ?3 month ICM trend: 09/10/2021. ? ? ? ?12-14 Month ICM trend:  ? ? ? ?[name], RN ?09/14/2021 ?8:49 AM ? ?

## 2021-10-15 ENCOUNTER — Ambulatory Visit (INDEPENDENT_AMBULATORY_CARE_PROVIDER_SITE_OTHER): Payer: 59

## 2021-10-15 DIAGNOSIS — Z95810 Presence of automatic (implantable) cardiac defibrillator: Secondary | ICD-10-CM | POA: Diagnosis not present

## 2021-10-15 DIAGNOSIS — I5022 Chronic systolic (congestive) heart failure: Secondary | ICD-10-CM | POA: Diagnosis not present

## 2021-10-22 NOTE — Progress Notes (Signed)
EPIC Encounter for ICM Monitoring ? ?Patient Name: [name] is a 76 y.o. female ?Date: 10/22/2021 ?Primary Care Physican: Patient, No Pcp Per (Inactive) ?Primary Cardiologist: [name] ?Electrophysiologist: [name] ?Weight: unknown ?  ?Transmission reviewed. ?  ?Optivol thoracic impedance trending close to baseline. ?  ?Prescribed: No diuretic ?  ?Recommendations:  None ?  ?Follow-up plan: ICM clinic phone appointment on 11/19/2021.   91 day device clinic remote transmission 10/29/2021.       ?  ?EP/Cardiology Office Visits: 12/14/2021 with Dr. [name].   ?  ?Copy of ICM check sent to Dr. [name].    ? ?3 month ICM trend: 10/15/2021. ? ? ? ?12-14 Month ICM trend:  ? ? ? ?[name], RN ?10/22/2021 ?4:36 PM ? ?

## 2021-10-29 ENCOUNTER — Ambulatory Visit (INDEPENDENT_AMBULATORY_CARE_PROVIDER_SITE_OTHER): Payer: 59

## 2021-10-29 DIAGNOSIS — I255 Ischemic cardiomyopathy: Secondary | ICD-10-CM | POA: Diagnosis not present

## 2021-10-30 LAB — CUP PACEART REMOTE DEVICE CHECK
Battery Remaining Longevity: 36 mo
Battery Voltage: 2.95 V
Brady Statistic RV Percent Paced: 0.01 %
Date Time Interrogation Session: 20230515122208
HighPow Impedance: 44 Ohm
Implantable Lead Implant Date: 20150605
Implantable Lead Location: 753860
Implantable Pulse Generator Implant Date: 20150605
Lead Channel Impedance Value: 285 Ohm
Lead Channel Impedance Value: 342 Ohm
Lead Channel Pacing Threshold Amplitude: 0.75 V
Lead Channel Pacing Threshold Pulse Width: 0.4 ms
Lead Channel Sensing Intrinsic Amplitude: 11.25 mV
Lead Channel Sensing Intrinsic Amplitude: 11.25 mV
Lead Channel Setting Pacing Amplitude: 2.5 V
Lead Channel Setting Pacing Pulse Width: 0.4 ms
Lead Channel Setting Sensing Sensitivity: 0.3 mV

## 2021-11-15 NOTE — Progress Notes (Signed)
Remote ICD transmission.   

## 2021-11-19 ENCOUNTER — Ambulatory Visit (INDEPENDENT_AMBULATORY_CARE_PROVIDER_SITE_OTHER): Payer: 59

## 2021-11-19 DIAGNOSIS — Z95810 Presence of automatic (implantable) cardiac defibrillator: Secondary | ICD-10-CM | POA: Diagnosis not present

## 2021-11-19 DIAGNOSIS — I5022 Chronic systolic (congestive) heart failure: Secondary | ICD-10-CM

## 2021-11-22 NOTE — Progress Notes (Signed)
EPIC Encounter for ICM Monitoring  Patient Name: [name] is a 76 y.o. female Date: 11/22/2021 Primary Care Physican: Patient, No Pcp Per (Inactive) Primary Cardiologist: Lovena Le Electrophysiologist: Lovena Le Weight: unknown   Transmission reviewed.   Optivol thoracic impedance trending close to baseline.   Prescribed: No diuretic   Recommendations:  None   Follow-up plan: ICM clinic phone appointment on 12/24/2021.   91 day device clinic remote transmission 01/28/2022.         EP/Cardiology Office Visits: 12/14/2021 with Dr. Lovena Le.     Copy of ICM check sent to Dr. Lovena Le.      3 month ICM trend: 11/19/2021.    12-14 Month ICM trend:     [name], RN 11/22/2021 3:18 PM

## 2021-12-14 ENCOUNTER — Telehealth: Payer: Self-pay | Admitting: Internal Medicine

## 2021-12-14 ENCOUNTER — Encounter: Payer: 59 | Admitting: Internal Medicine

## 2021-12-14 NOTE — Telephone Encounter (Signed)
Pt's sister returning nurses call. Call transferred

## 2021-12-14 NOTE — Telephone Encounter (Signed)
Call back received from Pt's sister.  Appt scheduled for today cancelled d/t Pt receiving treatment for right arm swelling locally.  Will reschedule Pt for 3 months from  now.  Advised to call if any further assistance needed.

## 2021-12-24 ENCOUNTER — Ambulatory Visit (INDEPENDENT_AMBULATORY_CARE_PROVIDER_SITE_OTHER): Payer: 59

## 2021-12-24 DIAGNOSIS — I5022 Chronic systolic (congestive) heart failure: Secondary | ICD-10-CM

## 2021-12-24 DIAGNOSIS — Z95810 Presence of automatic (implantable) cardiac defibrillator: Secondary | ICD-10-CM

## 2021-12-25 NOTE — Progress Notes (Unsigned)
Cardiology Office Note Date:  12/26/2021  Patient ID:  [name], DOB 03/25/1946, MRN [id number] PCP:  Patient, No Pcp Per  Electrophysiologist: Dr. Lovena Le    Chief Complaint:  RUE edema  History of Present Illness: [name] is a 76 y.o. female with history of stroke (L hemiparesis), ICM, chronic CHF (systolic), ICD, CAD (remote MI 2000), HTN, HLD.  She comes in today to be seen for Dr. Lovena Le, last seen by him March 2023, she denies any CP, SOB, he noted RUE was swollen and unable to arrange timely outpt [place] and referred to the local ER in [place] where she lives. Had class II symptoms, no changes were made.  Phone note from her sister reports [place] was negative and looking for further advisement, was recommended to elevate, keep a low salt diet and f/u with PMD.  12/14/21 another phone note, cancelling her appt for that day reporting getting local care for her RUE swelling.  Last available labs 08/30/20:  K+ 5.4 BUN/Creat 26/1.13 AST 60 ALT 80 Mag 2.4   TODAY She is accompanied by her sister. She is here as the recommendation of her PMD, apparently had wanted her to see a vascular specialist, though the team apparently in [place] or [place] do not take her insurance and she was advised to come back here. They report that she has had 2 [place] neither finding any blood clots. She did therapy squeezing a ball and elevating her arm that helps but returns. No CP, no SOB No palpitations or cardiac awareness. No near syncope or syncope. They report good compliance with her medicines.  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 Dx 05/2005;  b. 10/2013 Echo:  EF 25-30%, DK and scarring of mid-distal antsept, inf, infsept, & apical myocardium. Severe HK of anterior wall. Gr 1 DD.   CVA (cerebral vascular accident) ([place]) 2002, 2008, "mini-stroke"  Oct 2016   Dr. Rob Hickman; "LUE very weak; left leg a little weak but can still walk"    Deafness in left ear    Depression    Dyslipidemia    Hypertension    Ischemic cardiomyopathy December 2006   a. 10/2013 Echo: EF 25-30%;  b. 11/2013 MDT single Lead ICD gen change and lead revision.   Uterine fibroid     Past Surgical History:  Procedure Laterality Date   ABDOMINAL HYSTERECTOMY     "partial"   APPENDECTOMY     CARDIAC DEFIBRILLATOR PLACEMENT  05/2005; 11/19/2013   MDT ICD implanted 2006 by Dr Lovena Le with [phone] lead; gen change and RV lead revision 11/2013 by Dr Lovena Le   CHOLECYSTECTOMY     IMPLANTABLE CARDIOVERTER DEFIBRILLATOR REVISION N/A 11/19/2013   Procedure: IMPLANTABLE CARDIOVERTER DEFIBRILLATOR REVISION;  Surgeon: Evans Lance, MD;  Location: [facility] CATH LAB;  Service: Cardiovascular;  Laterality: N/A;   TONSILLECTOMY      Current Outpatient Medications  Medication Sig Dispense Refill   alendronate (FOSAMAX) 70 MG tablet Take 1 tablet by mouth once a week.     amLODipine (NORVASC) 5 MG tablet Take 5 mg by mouth daily.  6   carvedilol (COREG) 3.125 MG tablet Take 3.125 mg by mouth 2 (two)  times daily.  0   Cholecalciferol (VITAMIN D) 2000 UNITS CAPS Take 1 capsule by mouth daily.     clopidogrel (PLAVIX) 75 MG tablet Take 75 mg by mouth daily.     rosuvastatin (CRESTOR) 20 MG tablet Take 20 mg by mouth daily.     tetrahydrozoline 0.05 % ophthalmic solution Place 1 drop into both eyes 2 (two) times daily.     ENSURE (ENSURE) Take 237 mLs by mouth daily.  (Patient not taking: Reported on 12/26/2021)     No current facility-administered medications for this visit.    Allergies:   Beta adrenergic blockers   Social History:  The patient  reports that she quit smoking about 14 years ago. Her smoking  use included cigarettes. She has a 4.20 pack-year smoking history. She has never used smokeless tobacco. She reports that she does not drink alcohol and does not use drugs.   Family History:  The patient's family history includes Heart disease (age of onset: 42) in her mother; Mental retardation in her sister.  ROS:  Please see the history of present illness.    All other systems are reviewed and otherwise negative.   PHYSICAL EXAM:  VS:  BP 118/74 (BP Location: Left Arm, Patient Position: Sitting, Cuff Size: Normal)   Pulse 71   Ht '5\' 1"'$  (1.549 m)   Wt 100 lb 12.8 oz (45.7 kg)   SpO2 98%   BMI 19.05 kg/m  BMI: Body mass index is 19.05 kg/m. Well nourished, well developed, in no acute distress HEENT: normocephalic, atraumatic, prominent, protruding lower lip (for years unchanged) Neck: no JVD, carotid bruits or masses Cardiac:  RRR; no significant murmurs, no rubs, or gallops Lungs:  CTA b/l, no wheezing, rhonchi or rales Abd: soft, nontender MS: no deformity, advanced atrophy, quite thin body habitus Ext: no LE edema, RUE swelling noted starts just proximal to the bicep, no erythema, not warm, no rash, pitting towards her wrist  Skin: warm and dry, no rash Neuro:  L sided deficits appreciated, and reported as chronic Psych: euthymic mood, full affect  ICD site is LEFT sided and stable, no tethering or discomfort   EKG: not done today  Device interrogation done today and reviewed by myself:  Battery and lead measurements are good 3 NSVTs   04/26/2015: TTE Study Conclusions  - Left ventricle: The cavity size was normal. Wall thickness was    increased in a pattern of mild LVH. Systolic function was    severely reduced. The estimated ejection fraction was in the    range of 25% to 30%. There is akinesis to dyskinesis of the    mid-apical anteroseptal, anterior, inferolateral, inferior, and    apical myocardium. Doppler parameters are consistent with    abnormal left  ventricular relaxation (grade 1 diastolic    dysfunction).  - Aortic valve: Mildly calcified annulus. Trileaflet; mildly    calcified leaflets.  - Mitral valve: Calcified annulus. There was trivial regurgitation.  - Right ventricle: The cavity size was moderately decreased.  - Right atrium: Central venous pressure (est): 0-3 mm Hg.  - Atrial septum: No defect or patent foramen ovale was identified.  - Pulmonary arteries: Systolic pressure could not be accurately    estimated.  - Pericardium, extracardiac: A small pericardial effusion was    identified anterior to the heart and at the apex.    04/26/2015: stress myoview There was nondiagnostic accentuation in resting ST segment abnormalities in the inferolateral leads. There is a large  predominantly fixed, moderate to severe intensity defect involving the entire apex, apical to basal inferior wall, apical to basal inferoseptal wall, mid to apical anteroseptal wall, mid to apical anterior wall, and a small degree of reversibility in the basal inferolateral wall. This is most consistent with large region of scar and associated minor peri-infarct ischemia. This is a high risk study based on reduction in LVEF and defect size, but not relative ischemic burden. Nuclear stress EF: 27%.  Recent Labs: No results found for requested labs within last 365 days.  No results found for requested labs within last 365 days.   CrCl cannot be calculated (Patient's most recent lab result is older than the maximum 21 days allowed.).   Wt Readings from Last 3 Encounters:  12/26/21 100 lb 12.8 oz (45.7 kg)  08/16/21 102 lb (46.3 kg)  08/08/20 104 lb 6.4 oz (47.4 kg)     Other studies reviewed: Additional studies/records reviewed today include: summarized above  ASSESSMENT AND PLAN:  ICD Intact function No programming changes made  ICM Chronic CHF (systolic) She does NOT look volume OL by exam or symptoms She has not had HF hospitalizations Would  think about switching her amlodipine to ARB, though given clinical stability, held off Her LE protrusion is marked, but chronic for years, unclear what from, not allergy  CAD Unknown extent of CAD Chart reports MI 2000 Stress test 2016: large region of scar and associated minor peri-infarct ischemia No symptoms Lipids managed by her PMD On BB, plavix, statin  HTN Looks good No changes  6. RUE edema I suspect this is lymph, venous insufficiency for some reason Reportedly 2 [place] without DVT  Encouraged ongoing exercise and elevation Will refer to [facility] for further evaluation   Disposition: F/u with DR. Taylor in [place], to f/u , ? +/- change to ARB  Current medicines are reviewed at length with the patient today.  The patient did not have any concerns regarding medicines.  Venetia Night, PA-C 12/26/2021 4:46 PM     [place] [address] [phone] (office)  [phone] (fax)

## 2021-12-26 ENCOUNTER — Ambulatory Visit (INDEPENDENT_AMBULATORY_CARE_PROVIDER_SITE_OTHER): Payer: 59 | Admitting: Physician Assistant

## 2021-12-26 ENCOUNTER — Encounter: Payer: Self-pay | Admitting: Physician Assistant

## 2021-12-26 VITALS — BP 118/74 | HR 71 | Ht 61.0 in | Wt 100.8 lb

## 2021-12-26 DIAGNOSIS — I2510 Atherosclerotic heart disease of native coronary artery without angina pectoris: Secondary | ICD-10-CM | POA: Diagnosis not present

## 2021-12-26 DIAGNOSIS — I255 Ischemic cardiomyopathy: Secondary | ICD-10-CM

## 2021-12-26 DIAGNOSIS — I5022 Chronic systolic (congestive) heart failure: Secondary | ICD-10-CM | POA: Diagnosis not present

## 2021-12-26 DIAGNOSIS — Z95810 Presence of automatic (implantable) cardiac defibrillator: Secondary | ICD-10-CM | POA: Diagnosis not present

## 2021-12-26 DIAGNOSIS — M7989 Other specified soft tissue disorders: Secondary | ICD-10-CM | POA: Diagnosis not present

## 2021-12-26 LAB — CUP PACEART INCLINIC DEVICE CHECK
Battery Remaining Longevity: 33 mo
Battery Voltage: 2.94 V
Brady Statistic RV Percent Paced: 0.01 %
Date Time Interrogation Session: 20230712183036
HighPow Impedance: 50 Ohm
Implantable Lead Implant Date: 20150605
Implantable Lead Location: 753860
Implantable Pulse Generator Implant Date: 20150605
Lead Channel Impedance Value: 285 Ohm
Lead Channel Impedance Value: 361 Ohm
Lead Channel Pacing Threshold Amplitude: 0.625 V
Lead Channel Pacing Threshold Pulse Width: 0.4 ms
Lead Channel Sensing Intrinsic Amplitude: 10.25 mV
Lead Channel Sensing Intrinsic Amplitude: 12.625 mV
Lead Channel Setting Pacing Amplitude: 2.5 V
Lead Channel Setting Pacing Pulse Width: 0.4 ms
Lead Channel Setting Sensing Sensitivity: 0.3 mV

## 2021-12-26 NOTE — Patient Instructions (Addendum)
Medication Instructions:  Your physician recommends that you continue on your current medications as directed. Please refer to the Current Medication list given to you today.  *If you need a refill on your cardiac medications before your next appointment, please call your pharmacy*   Lab Work: NONE If you have labs (blood work) drawn today and your tests are completely normal, you will receive your results only by: [place] (if you have MyChart) OR A paper copy in the mail If you have any lab test that is abnormal or we need to change your treatment, we will call you to review the results.   Testing/Procedures: NON   Follow-Up: At [facility], you and your health needs are our priority.  As part of our continuing mission to provide you with exceptional heart care, we have created designated Provider Care Teams.  These Care Teams include your primary Cardiologist (physician) and Advanced Practice Providers (APPs -  Physician Assistants and Nurse Practitioners) who all work together to provide you with the care you need, when you need it.  We recommend signing up for the patient portal called "MyChart".  Sign up information is provided on this After Visit Summary.  MyChart is used to connect with patients for Virtual Visits (Telemedicine).  Patients are able to view lab/test results, encounter notes, upcoming appointments, etc.  Non-urgent messages can be sent to your provider as well.   To learn more about what you can do with MyChart, go to [email or web address].    Your next appointment:   6 week(s)  The format for your next appointment:   In Person  Provider:   Cristopher Peru, MD{  YOU HAVE BEEN REFERRED TO [place].   Important Information About Sugar

## 2021-12-27 NOTE — Progress Notes (Signed)
EPIC Encounter for ICM Monitoring  Patient Name: [name] is a 76 y.o. female Date: 12/27/2021 Primary Care Physican: Patient, No Pcp Per Primary Cardiologist: Lovena Le Electrophysiologist: Lovena Le 12/26/2021 Office Weight: 100 lbs   Transmission reviewed.   Optivol thoracic impedance suggesting normal fluid levels.   Prescribed: No diuretic   Recommendations:  None   Follow-up plan: ICM clinic phone appointment on 01/29/2022.   91 day device clinic remote transmission 01/28/2022.         EP/Cardiology Office Visits: 02/21/2022 with Dr. Lovena Le.     Copy of ICM check sent to Dr. Lovena Le.     3 month ICM trend: 12/24/2021.    12-14 Month ICM trend:     [name], RN 12/27/2021 8:01 AM

## 2022-01-28 ENCOUNTER — Ambulatory Visit: Payer: 59

## 2022-01-28 LAB — CUP PACEART REMOTE DEVICE CHECK
Battery Remaining Longevity: 34 mo
Battery Voltage: 2.95 V
Brady Statistic RV Percent Paced: 0 %
Date Time Interrogation Session: 20230814082825
HighPow Impedance: 45 Ohm
Implantable Lead Implant Date: 20150605
Implantable Lead Location: 753860
Implantable Pulse Generator Implant Date: 20150605
Lead Channel Impedance Value: 285 Ohm
Lead Channel Impedance Value: 304 Ohm
Lead Channel Pacing Threshold Amplitude: 0.75 V
Lead Channel Pacing Threshold Pulse Width: 0.4 ms
Lead Channel Sensing Intrinsic Amplitude: 9.75 mV
Lead Channel Sensing Intrinsic Amplitude: 9.75 mV
Lead Channel Setting Pacing Amplitude: 2.5 V
Lead Channel Setting Pacing Pulse Width: 0.4 ms
Lead Channel Setting Sensing Sensitivity: 0.3 mV

## 2022-01-29 ENCOUNTER — Ambulatory Visit (INDEPENDENT_AMBULATORY_CARE_PROVIDER_SITE_OTHER): Payer: 59

## 2022-01-29 DIAGNOSIS — Z95810 Presence of automatic (implantable) cardiac defibrillator: Secondary | ICD-10-CM | POA: Diagnosis not present

## 2022-01-29 DIAGNOSIS — I5022 Chronic systolic (congestive) heart failure: Secondary | ICD-10-CM

## 2022-01-29 NOTE — Progress Notes (Unsigned)
EPIC Encounter for ICM Monitoring  Patient Name: [name] is a 76 y.o. female Date: 01/29/2022 Primary Care Physican: Patient, No Pcp Per Primary Cardiologist: Lovena Le Electrophysiologist: Lovena Le 12/26/2021 Office Weight: 100 lbs   Transmission reviewed.   Optivol thoracic impedance suggesting normal fluid levels.   Prescribed: No diuretic   Recommendations:  None   Follow-up plan: ICM clinic phone appointment on 03/04/2022.   91 day device clinic remote transmission 04/29/2022.         EP/Cardiology Office Visits: 03/05/2022 with Dr. Lovena Le.     Copy of ICM check sent to Dr. Lovena Le.     3 month ICM trend: 01/28/2022.    12-14 Month ICM trend:     [name], RN 01/29/2022 8:07 AM

## 2022-02-12 ENCOUNTER — Other Ambulatory Visit: Payer: Self-pay | Admitting: *Deleted

## 2022-02-12 DIAGNOSIS — M7989 Other specified soft tissue disorders: Secondary | ICD-10-CM

## 2022-02-20 ENCOUNTER — Ambulatory Visit (INDEPENDENT_AMBULATORY_CARE_PROVIDER_SITE_OTHER): Payer: 59 | Admitting: Vascular Surgery

## 2022-02-20 ENCOUNTER — Encounter: Payer: Self-pay | Admitting: Vascular Surgery

## 2022-02-20 ENCOUNTER — Ambulatory Visit (HOSPITAL_COMMUNITY)
Admission: RE | Admit: 2022-02-20 | Discharge: 2022-02-20 | Disposition: A | Discharge status: Home / Self Care | Payer: 59 | Source: Ambulatory Visit | Attending: Vascular Surgery | Admitting: Vascular Surgery

## 2022-02-20 VITALS — BP 146/77 | HR 83 | Temp 98.8°F | Resp 16 | Ht 61.0 in | Wt 106.0 lb

## 2022-02-20 DIAGNOSIS — I890 Lymphedema, not elsewhere classified: Secondary | ICD-10-CM

## 2022-02-20 DIAGNOSIS — M7989 Other specified soft tissue disorders: Secondary | ICD-10-CM | POA: Diagnosis not present

## 2022-02-20 NOTE — Progress Notes (Signed)
ASSESSMENT & PLAN   LYMPHEDEMA RIGHT UPPER EXTREMITY: This patient has evidence of lymphedema in the right upper extremity which she has had for 3 weeks.  Not sure that she has been elevating her arm effectively and we have discussed the proper positioning for this.  In addition I have shown her how to use some mild compression using Ace bandages.  She has no later etiology for her lymphedema.  She has had no previous breast surgery or axillary dissection.  She has an AICD on the left but has had no catheters or pacemakers on the right.  She has had no injury or radiation therapy.  She does not have any significant pain associated with this.  I plan on seeing her back in 4 weeks.  If this is not improved we will get a CT of the right upper extremity to rule out any type of obstruction.  I reassured her that her venous duplex scan did not show any evidence of DVT.  REASON FOR CONSULT:    Swelling of the right upper extremity.  The consult is requested by Dr. Charlcie Cradle.  HPI:   [name] is a 76 y.o. female who is referred with right upper extremity swelling.  The patient has a history of a previous stroke associated with left hemiparesis.  In addition she has a history of ischemic cardiomyopathy, chronic systolic congestive heart failure, coronary artery disease with remote history of myocardial infarction, hypertension, hyperlipidemia, and a history of an AICD.  On my history the patient developed a gradual onset of swelling in the right upper extremity about 3 weeks ago.  She has not had any recent trauma to her arm.  She has no history of breast cancer and gets mammograms yearly.  She has had no previous axillary dissection or radiation therapy.  She has an AICD on the left side but is had no catheters or pacemakers on the right side.  She denies any other symptoms such as weight loss fever or chills.  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 Dx 05/2005;  b. 10/2013 Echo: EF 25-30%, DK and scarring of mid-distal antsept, inf, infsept, & apical myocardium. Severe HK of anterior wall. Gr 1 DD.   CVA (cerebral vascular accident) ([place]) 2002, 2008, "mini-stroke"  Oct 2016   Dr. Rob Hickman; "LUE very weak; left leg a little weak but can still walk"    Deafness in left ear    Depression    Dyslipidemia    Hypertension    Ischemic cardiomyopathy December 2006   a. 10/2013 Echo: EF 25-30%;  b. 11/2013 MDT single Lead ICD gen change and lead revision.   Uterine fibroid     Family History  Problem Relation Age of Onset   Heart disease Mother 40       Cerebrovascular Accident   Mental retardation Sister     SOCIAL HISTORY: Social History   Tobacco Use   Smoking status: Former    Packs/day: 0.10    Years: 42.00    Total pack years: 4.20    Types: Cigarettes    Quit date: 06/18/2007    Years since quitting: 14.6   Smokeless tobacco: Never  Substance Use Topics   Alcohol use: No    Alcohol/week: 0.0 standard drinks of alcohol  Comment: "stopped drinking alcohol in the 1990's"    Allergies  Allergen Reactions   Beta Adrenergic Blockers Other (See Comments)    fatigue    Current Outpatient Medications  Medication Sig Dispense Refill   alendronate (FOSAMAX) 70 MG tablet Take 1 tablet by mouth once a week.     amLODipine (NORVASC) 5 MG tablet Take 5 mg by mouth daily.  6   carvedilol (COREG) 3.125 MG tablet Take 3.125 mg by mouth 2 (two) times daily.  0   Cholecalciferol (VITAMIN D) 2000 UNITS CAPS Take 1 capsule by mouth daily.     clopidogrel (PLAVIX) 75 MG tablet Take 75 mg by mouth daily.     rosuvastatin (CRESTOR) 20 MG tablet Take 20 mg by mouth daily.     tetrahydrozoline 0.05 % ophthalmic solution Place 1 drop into both eyes 2 (two) times daily.     ENSURE (ENSURE) Take 237 mLs by  mouth daily.  (Patient not taking: Reported on 12/26/2021)     No current facility-administered medications for this visit.    REVIEW OF SYSTEMS:  '[X]'$  denotes positive finding, '[ ]'$  denotes negative finding Cardiac  Comments:  Chest pain or chest pressure:    Shortness of breath upon exertion:    Short of breath when lying flat:    Irregular heart rhythm:        Vascular    Pain in calf, thigh, or hip brought on by ambulation:    Pain in feet at night that wakes you up from your sleep:     Blood clot in your veins:    Leg swelling:         Pulmonary    Oxygen at home:    Productive cough:     Wheezing:         Neurologic    Sudden weakness in arms or legs:     Sudden numbness in arms or legs:     Sudden onset of difficulty speaking or slurred speech:    Temporary loss of vision in one eye:     Problems with dizziness:         Gastrointestinal    Blood in stool:     Vomited blood:         Genitourinary    Burning when urinating:     Blood in urine:        Psychiatric    Major depression:         Hematologic    Bleeding problems:    Problems with blood clotting too easily:        Skin    Rashes or ulcers:        Constitutional    Fever or chills:    -  PHYSICAL EXAM:   Vitals:   02/20/22 1309  BP: (!) 146/77  Pulse: 83  Resp: 16  Temp: 98.8 F (37.1 C)  TempSrc: Temporal  SpO2: 99%  Weight: 106 lb (48.1 kg)  Height: '5\' 1"'$  (1.549 m)   Body mass index is 20.03 kg/m. GENERAL: The patient is a well-nourished female, in no acute distress. The vital signs are documented above. CARDIAC: There is a regular rate and rhythm.  VASCULAR: I do not detect carotid bruits. She has palpable radial pulses. She has moderate right upper extremity swelling with nonpitting edema in her forearm and upper arm.  There is no swelling in the axilla. I do not appreciate any axillary adenopathy. PULMONARY: There is good air exchange bilaterally  without wheezing or  rales. MUSCULOSKELETAL: There are no major deformities. NEUROLOGIC: No focal weakness or paresthesias are detected. SKIN: There are no ulcers or rashes noted. PSYCHIATRIC: The patient has a normal affect.  DATA:    VENOUS DUPLEX RIGHT UPPER EXTREMITY: I independently interpreted her venous duplex of the right upper extremity.  There is no evidence of DVT.  Deitra Mayo [facility]

## 2022-02-21 ENCOUNTER — Encounter: Payer: 59 | Admitting: Internal Medicine

## 2022-03-04 ENCOUNTER — Ambulatory Visit (INDEPENDENT_AMBULATORY_CARE_PROVIDER_SITE_OTHER): Payer: 59

## 2022-03-04 DIAGNOSIS — I5022 Chronic systolic (congestive) heart failure: Secondary | ICD-10-CM

## 2022-03-04 DIAGNOSIS — Z95810 Presence of automatic (implantable) cardiac defibrillator: Secondary | ICD-10-CM | POA: Diagnosis not present

## 2022-03-05 ENCOUNTER — Ambulatory Visit: Payer: 59 | Attending: Internal Medicine | Admitting: Internal Medicine

## 2022-03-05 ENCOUNTER — Encounter: Payer: Self-pay | Admitting: Internal Medicine

## 2022-03-05 VITALS — BP 148/80 | HR 86 | Ht 60.0 in | Wt 103.2 lb

## 2022-03-05 DIAGNOSIS — I5022 Chronic systolic (congestive) heart failure: Secondary | ICD-10-CM | POA: Diagnosis not present

## 2022-03-05 DIAGNOSIS — Z95810 Presence of automatic (implantable) cardiac defibrillator: Secondary | ICD-10-CM | POA: Diagnosis not present

## 2022-03-05 DIAGNOSIS — I255 Ischemic cardiomyopathy: Secondary | ICD-10-CM

## 2022-03-05 NOTE — Progress Notes (Signed)
HPI Ms. Heuring returns today for followup. She is a pleasnat 76 yo woman with an ICM, s/p remote MI, class 2 CHF, prior stroke with left HP, s/p ICD insertion. She c/o right arm swelling. She had an ultrasound which did not show a DVT.She denies any trauma. No chest pain or sob. No edema in the legs.  Allergies  Allergen Reactions   Beta Adrenergic Blockers Other (See Comments)    fatigue     Current Outpatient Medications  Medication Sig Dispense Refill   alendronate (FOSAMAX) 70 MG tablet Take 1 tablet by mouth once a week.     amLODipine (NORVASC) 5 MG tablet Take 5 mg by mouth daily.  6   carvedilol (COREG) 3.125 MG tablet Take 3.125 mg by mouth 2 (two) times daily.  0   Cholecalciferol (VITAMIN D) 2000 UNITS CAPS Take 1 capsule by mouth daily.     clopidogrel (PLAVIX) 75 MG tablet Take 75 mg by mouth daily.     ENSURE (ENSURE) Take 237 mLs by mouth daily.     rosuvastatin (CRESTOR) 20 MG tablet Take 20 mg by mouth daily.     tetrahydrozoline 0.05 % ophthalmic solution Place 1 drop into both eyes 2 (two) times daily.     No current facility-administered medications for this visit.     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 Dx 05/2005;  b. 10/2013 Echo: EF 25-30%, DK and scarring of mid-distal antsept, inf, infsept, & apical myocardium. Severe HK of anterior wall. Gr 1 DD.   CVA (cerebral vascular accident) ([place]) 2002, 2008, "mini-stroke"  Oct 2016   Dr. Rob Hickman; "LUE very weak; left leg a little weak but can still walk"    Deafness in left ear    Depression    Dyslipidemia    Hypertension    Ischemic cardiomyopathy December 2006   a. 10/2013 Echo: EF 25-30%;  b. 11/2013 MDT single Lead ICD gen change and lead revision.   Uterine fibroid     ROS:   All systems reviewed and negative  except as noted in the HPI.   Past Surgical History:  Procedure Laterality Date   ABDOMINAL HYSTERECTOMY     "partial"   APPENDECTOMY     CARDIAC DEFIBRILLATOR PLACEMENT  05/2005; 11/19/2013   MDT ICD implanted 2006 by Dr Lovena Le with [id number] lead; gen change and RV lead revision 11/2013 by Dr Lovena Le   CHOLECYSTECTOMY     IMPLANTABLE CARDIOVERTER DEFIBRILLATOR REVISION N/A 11/19/2013   Procedure: IMPLANTABLE CARDIOVERTER DEFIBRILLATOR REVISION;  Surgeon: Evans Lance, MD;  Location: [facility] CATH LAB;  Service: Cardiovascular;  Laterality: N/A;   TONSILLECTOMY       Family History  Problem Relation Age of Onset   Heart disease Mother 50       Cerebrovascular Accident   Mental retardation Sister      Social History   Socioeconomic History   Marital status: Legally Separated    Spouse name: Not on file   Number of children: Not on file   Years of education: Not on file   Highest education level: Not on file  Occupational History   Occupation: disabled  Tobacco Use   Smoking status: Former    Packs/day: 0.10    Years: 42.00    Total  pack years: 4.20    Types: Cigarettes    Quit date: 06/18/2007    Years since quitting: 14.7   Smokeless tobacco: Never  Vaping Use   Vaping Use: Never used  Substance and Sexual Activity   Alcohol use: No    Alcohol/week: 0.0 standard drinks of alcohol    Comment: "stopped drinking alcohol in the 1990's"   Drug use: No    Types: Cocaine    Comment: "stopped using drugs "before 2009"   Sexual activity: Never  Other Topics Concern   Not on file  Social History Narrative   Not on file   Social Determinants of Health   Financial Resource Strain: Not on file  Food Insecurity: Not on file  Transportation Needs: Not on file  Physical Activity: Not on file  Stress: Not on file  Social Connections: Not on file  Intimate Partner Violence: Not on file     BP (!) 148/80   Pulse 86   Ht 5' (1.524 m)   Wt 103 lb 3.2 oz (46.8 kg)   SpO2 98%    BMI 20.15 kg/m   Physical Exam:  Well appearing NAD HEENT: Unremarkable Neck:  No JVD, no thyromegally Lymphatics:  No adenopathy Back:  No CVA tenderness Lungs:  Clear with no wheezes HEART:  Regular rate rhythm, no murmurs, no rubs, no clicks Abd:  soft, positive bowel sounds, no organomegally, no rebound, no guarding Ext:  2 plus pulses, right arm with 2+ edema, no cyanosis, no clubbing; right radial pulse intact. Skin:  No rashes no nodules Neuro:  CN II through XII intact, motor grossly intact   DEVICE  Normal device function.  See PaceArt for details.   Assess/Plan:  ICD - her device is working normally. She has 2.4 years of battery longevity. Chronic systolic heart failure - she has class 2 symptoms. No change in her meds Right arm swelling. I explained the importance of keeping her left arm elevated. She will remain on plavix. CAD - she is s/p remote MI. We will follow.  Carleene Overlie Bessie Livingood,MD

## 2022-03-05 NOTE — Patient Instructions (Signed)
Medication Instructions:  Your physician recommends that you continue on your current medications as directed. Please refer to the Current Medication list given to you today.  *If you need a refill on your cardiac medications before your next appointment, please call your pharmacy*   Lab Work: NONE   If you have labs (blood work) drawn today and your tests are completely normal, you will receive your results only by: MyChart Message (if you have MyChart) OR A paper copy in the mail If you have any lab test that is abnormal or we need to change your treatment, we will call you to review the results.   Testing/Procedures: NONE    Follow-Up: At [facility], you and your health needs are our priority.  As part of our continuing mission to provide you with exceptional heart care, we have created designated Provider Care Teams.  These Care Teams include your primary Cardiologist (physician) and Advanced Practice Providers (APPs -  Physician Assistants and Nurse Practitioners) who all work together to provide you with the care you need, when you need it.  We recommend signing up for the patient portal called "MyChart".  Sign up information is provided on this After Visit Summary.  MyChart is used to connect with patients for Virtual Visits (Telemedicine).  Patients are able to view lab/test results, encounter notes, upcoming appointments, etc.  Non-urgent messages can be sent to your provider as well.   To learn more about what you can do with MyChart, go to [email or web address].    Your next appointment:   1 year(s)  The format for your next appointment:   In Person  Provider:   Gregg Taylor, MD    Other Instructions Thank you for choosing [facility]!    Important Information About Sugar       

## 2022-03-08 NOTE — Progress Notes (Signed)
EPIC Encounter for ICM Monitoring  Patient Name: [name] is a 76 y.o. female Date: 03/08/2022 Primary Care Physican: Patient, No Pcp Per Primary Cardiologist: Lovena Le Electrophysiologist: Lovena Le 12/26/2021 Office Weight: 100 lbs   Transmission reviewed.   Optivol thoracic impedance suggesting normal fluid levels.   Prescribed: No diuretic   Recommendations:  None   Follow-up plan: ICM clinic phone appointment on 04/08/2022.   91 day device clinic remote transmission 04/29/2022.         EP/Cardiology Office Visits:  Recall 02/28/2023 with Dr. Lovena Le.     Copy of ICM check sent to Dr. Lovena Le.     3 month ICM trend: 03/04/2022.    12-14 Month ICM trend:     [name], RN 03/08/2022 5:05 PM

## 2022-03-21 ENCOUNTER — Ambulatory Visit: Payer: 59 | Admitting: Vascular Surgery

## 2022-03-22 ENCOUNTER — Encounter: Payer: 59 | Admitting: Internal Medicine

## 2022-04-09 NOTE — Progress Notes (Signed)
No ICM remote transmission received for 04/08/2022 and next ICM transmission scheduled for 04/30/2022.

## 2022-04-29 ENCOUNTER — Ambulatory Visit (INDEPENDENT_AMBULATORY_CARE_PROVIDER_SITE_OTHER): Payer: 59

## 2022-04-29 DIAGNOSIS — I5022 Chronic systolic (congestive) heart failure: Secondary | ICD-10-CM

## 2022-04-30 ENCOUNTER — Ambulatory Visit (INDEPENDENT_AMBULATORY_CARE_PROVIDER_SITE_OTHER): Payer: 59

## 2022-04-30 DIAGNOSIS — I5022 Chronic systolic (congestive) heart failure: Secondary | ICD-10-CM | POA: Diagnosis not present

## 2022-04-30 DIAGNOSIS — Z95810 Presence of automatic (implantable) cardiac defibrillator: Secondary | ICD-10-CM

## 2022-04-30 LAB — CUP PACEART REMOTE DEVICE CHECK
Battery Remaining Longevity: 32 mo
Battery Voltage: 2.94 V
Brady Statistic RV Percent Paced: 0 %
Date Time Interrogation Session: 20231114084346
HighPow Impedance: 49 Ohm
Implantable Lead Connection Status: 753985
Implantable Lead Implant Date: 20150605
Implantable Lead Location: 753860
Implantable Pulse Generator Implant Date: 20150605
Lead Channel Impedance Value: 304 Ohm
Lead Channel Impedance Value: 342 Ohm
Lead Channel Pacing Threshold Amplitude: 0.625 V
Lead Channel Pacing Threshold Pulse Width: 0.4 ms
Lead Channel Sensing Intrinsic Amplitude: 6.5 mV
Lead Channel Sensing Intrinsic Amplitude: 6.5 mV
Lead Channel Setting Pacing Amplitude: 2.5 V
Lead Channel Setting Pacing Pulse Width: 0.4 ms
Lead Channel Setting Sensing Sensitivity: 0.3 mV
Zone Setting Status: 755011

## 2022-05-08 NOTE — Progress Notes (Signed)
EPIC Encounter for ICM Monitoring  Patient Name: [name] is a 76 y.o. female Date: 05/08/2022 Primary Care Physican: Patient, No Pcp Per Primary Cardiologist: Lovena Le Electrophysiologist: Lovena Le 03/05/2022 Office Weight: 103 lbs   Transmission reviewed.   Optivol thoracic impedance suggesting normal fluid levels with exception of possible fluid accumulation from 10/18-11/2.   Prescribed: No diuretic   Recommendations:  None   Follow-up plan: ICM clinic phone appointment on 06/03/2022.   91 day device clinic remote transmission 07/29/2022.         EP/Cardiology Office Visits:  Recall 02/28/2023 with Dr. Lovena Le.     Copy of ICM check sent to Dr. Lovena Le.      3 month ICM trend: 04/30/2022.    12-14 Month ICM trend:     [name], RN 05/08/2022 5:42 AM

## 2022-06-03 ENCOUNTER — Ambulatory Visit (INDEPENDENT_AMBULATORY_CARE_PROVIDER_SITE_OTHER): Payer: 59

## 2022-06-03 DIAGNOSIS — I5022 Chronic systolic (congestive) heart failure: Secondary | ICD-10-CM

## 2022-06-03 DIAGNOSIS — Z95810 Presence of automatic (implantable) cardiac defibrillator: Secondary | ICD-10-CM

## 2022-06-06 NOTE — Progress Notes (Signed)
Remote ICD transmission.   

## 2022-06-06 NOTE — Progress Notes (Signed)
EPIC Encounter for ICM Monitoring  Patient Name: [name] is a 76 y.o. female Date: 06/06/2022 Primary Care Physican: Patient, No Pcp Per Primary Cardiologist: Lovena Le Electrophysiologist: Lovena Le 03/05/2022 Office Weight: 103 lbs   Transmission reviewed.   Optivol thoracic impedance suggesting normal fluid levels.   Prescribed: No diuretic   Recommendations:  None   Follow-up plan: ICM clinic phone appointment on 07/08/2022.   91 day device clinic remote transmission 07/29/2022.         EP/Cardiology Office Visits:  Recall 02/28/2023 with Dr. Lovena Le.     Copy of ICM check sent to Dr. Lovena Le.    3 month ICM trend: 06/03/2022.    12-14 Month ICM trend:     [name], RN 06/06/2022 11:58 AM

## 2022-07-08 ENCOUNTER — Ambulatory Visit: Payer: 59 | Attending: Internal Medicine

## 2022-07-08 DIAGNOSIS — I5022 Chronic systolic (congestive) heart failure: Secondary | ICD-10-CM

## 2022-07-08 DIAGNOSIS — Z95810 Presence of automatic (implantable) cardiac defibrillator: Secondary | ICD-10-CM

## 2022-07-12 NOTE — Progress Notes (Signed)
EPIC Encounter for ICM Monitoring  Patient Name: [name] is a 77 y.o. female Date: 07/12/2022 Primary Care Physican: Patient, No Pcp Per Primary Cardiologist: Lovena Le Electrophysiologist: Lovena Le 03/05/2022 Office Weight: 103 lbs   Transmission reviewed.   Optivol thoracic impedance suggesting normal fluid levels.   Prescribed: No diuretic   Recommendations:  None   Follow-up plan: ICM clinic phone appointment on 08/12/2022.   91 day device clinic remote transmission 07/29/2022.         EP/Cardiology Office Visits:  Recall 02/28/2023 with Dr. Lovena Le.     Copy of ICM check sent to Dr. Lovena Le.    3 month ICM trend: 07/08/2022.    12-14 Month ICM trend:     [name], RN 07/12/2022 10:52 AM

## 2022-07-29 ENCOUNTER — Ambulatory Visit: Payer: 59

## 2022-07-29 DIAGNOSIS — I255 Ischemic cardiomyopathy: Secondary | ICD-10-CM

## 2022-07-29 DIAGNOSIS — I5022 Chronic systolic (congestive) heart failure: Secondary | ICD-10-CM

## 2022-07-30 LAB — CUP PACEART REMOTE DEVICE CHECK
Battery Remaining Longevity: 29 mo
Battery Voltage: 2.94 V
Brady Statistic RV Percent Paced: 0 %
Date Time Interrogation Session: 20240212172725
HighPow Impedance: 48 Ohm
Implantable Lead Connection Status: 753985
Implantable Lead Implant Date: 20150605
Implantable Lead Location: 753860
Implantable Pulse Generator Implant Date: 20150605
Lead Channel Impedance Value: 285 Ohm
Lead Channel Impedance Value: 361 Ohm
Lead Channel Pacing Threshold Amplitude: 0.75 V
Lead Channel Pacing Threshold Pulse Width: 0.4 ms
Lead Channel Sensing Intrinsic Amplitude: 5.125 mV
Lead Channel Sensing Intrinsic Amplitude: 5.125 mV
Lead Channel Setting Pacing Amplitude: 2.5 V
Lead Channel Setting Pacing Pulse Width: 0.4 ms
Lead Channel Setting Sensing Sensitivity: 0.3 mV
Zone Setting Status: 755011

## 2022-08-12 ENCOUNTER — Ambulatory Visit: Payer: 59 | Attending: Internal Medicine

## 2022-08-12 DIAGNOSIS — I5022 Chronic systolic (congestive) heart failure: Secondary | ICD-10-CM | POA: Diagnosis not present

## 2022-08-12 DIAGNOSIS — Z95810 Presence of automatic (implantable) cardiac defibrillator: Secondary | ICD-10-CM | POA: Diagnosis not present

## 2022-08-16 NOTE — Progress Notes (Signed)
EPIC Encounter for ICM Monitoring  Patient Name: [name] is a 77 y.o. female Date: 08/16/2022 Primary Care Physican: Patient, No Pcp Per Primary Cardiologist: Lovena Le Electrophysiologist: Lovena Le 03/05/2022 Office Weight: 103 lbs   Transmission reviewed.   Optivol thoracic impedance suggesting normal fluid levels.   Prescribed: No diuretic   Recommendations:  None   Follow-up plan: ICM clinic phone appointment on 09/16/2022.   91 day device clinic remote transmission 10/28/2022.         EP/Cardiology Office Visits:  Recall 02/28/2023 with Dr. Lovena Le.     Copy of ICM check sent to Dr. Lovena Le.    3 month ICM trend: 08/12/2022.    12-14 Month ICM trend:     [name], RN 08/16/2022 4:47 PM

## 2022-09-10 NOTE — Progress Notes (Signed)
Remote ICD transmission.   

## 2022-09-16 ENCOUNTER — Ambulatory Visit: Payer: 59 | Attending: Internal Medicine

## 2022-09-16 DIAGNOSIS — Z95810 Presence of automatic (implantable) cardiac defibrillator: Secondary | ICD-10-CM

## 2022-09-16 DIAGNOSIS — I5022 Chronic systolic (congestive) heart failure: Secondary | ICD-10-CM

## 2022-09-18 NOTE — Progress Notes (Signed)
EPIC Encounter for ICM Monitoring  Patient Name: [name] is a 77 y.o. female Date: 09/18/2022 Primary Care Physican: Patient, No Pcp Per Primary Cardiologist: [name] Electrophysiologist: [name] 03/05/2022 Office Weight: 103 lbs   Transmission reviewed.   Optivol thoracic impedance suggesting normal fluid levels.   Prescribed: No diuretic   Recommendations:  None   Follow-up plan: ICM clinic phone appointment on 10/21/2022.   91 day device clinic remote transmission 10/28/2022.         EP/Cardiology Office Visits:  Recall 02/28/2023 with Dr. [name].     Copy of ICM check sent to Dr. [name].     3 month ICM trend: 09/18/2022.    12-14 Month ICM trend:     Tiffany Billings, RN 09/18/2022 1:17 PM

## 2022-10-25 ENCOUNTER — Ambulatory Visit: Payer: 59 | Attending: Internal Medicine

## 2022-10-25 DIAGNOSIS — Z95810 Presence of automatic (implantable) cardiac defibrillator: Secondary | ICD-10-CM | POA: Diagnosis not present

## 2022-10-25 DIAGNOSIS — I5022 Chronic systolic (congestive) heart failure: Secondary | ICD-10-CM | POA: Diagnosis not present

## 2022-10-25 NOTE — Progress Notes (Signed)
EPIC Encounter for ICM Monitoring  Patient Name: [name] is a 77 y.o. female Date: 10/25/2022 Primary Care Physican: Patient, No Pcp Per Primary Cardiologist: Ladona Ridgel Electrophysiologist: Ladona Ridgel 03/05/2022 Office Weight: 103 lbs   Transmission reviewed.   Optivol thoracic impedance suggesting normal fluid levels.   Prescribed: No diuretic   Recommendations:  None   Follow-up plan: ICM clinic phone appointment on 12/02/2022.   91 day device clinic remote transmission 01/27/2023.         EP/Cardiology Office Visits:   02/25/2023 with Dr. Ladona Ridgel.     Copy of ICM check sent to Dr. Ladona Ridgel.    3 month ICM trend: 10/21/2022.    12-14 Month ICM trend:     Tiffany Soda, RN 10/25/2022 4:56 PM

## 2022-10-28 ENCOUNTER — Ambulatory Visit (INDEPENDENT_AMBULATORY_CARE_PROVIDER_SITE_OTHER): Payer: 59

## 2022-10-28 DIAGNOSIS — I5022 Chronic systolic (congestive) heart failure: Secondary | ICD-10-CM

## 2022-10-28 DIAGNOSIS — I255 Ischemic cardiomyopathy: Secondary | ICD-10-CM

## 2022-10-29 LAB — CUP PACEART REMOTE DEVICE CHECK
Battery Remaining Longevity: 27 mo
Battery Voltage: 2.93 V
Brady Statistic RV Percent Paced: 0.01 %
Date Time Interrogation Session: 20240513123426
HighPow Impedance: 49 Ohm
Implantable Lead Connection Status: 753985
Implantable Lead Implant Date: 20150605
Implantable Lead Location: 753860
Implantable Pulse Generator Implant Date: 20150605
Lead Channel Impedance Value: 285 Ohm
Lead Channel Impedance Value: 361 Ohm
Lead Channel Pacing Threshold Amplitude: 0.75 V
Lead Channel Pacing Threshold Pulse Width: 0.4 ms
Lead Channel Sensing Intrinsic Amplitude: 9.75 mV
Lead Channel Sensing Intrinsic Amplitude: 9.75 mV
Lead Channel Setting Pacing Amplitude: 2.5 V
Lead Channel Setting Pacing Pulse Width: 0.4 ms
Lead Channel Setting Sensing Sensitivity: 0.3 mV
Zone Setting Status: 755011

## 2022-11-21 NOTE — Progress Notes (Signed)
Remote ICD transmission.   

## 2022-12-02 ENCOUNTER — Ambulatory Visit: Payer: 59 | Attending: Internal Medicine

## 2022-12-02 DIAGNOSIS — Z95810 Presence of automatic (implantable) cardiac defibrillator: Secondary | ICD-10-CM

## 2022-12-02 DIAGNOSIS — I5022 Chronic systolic (congestive) heart failure: Secondary | ICD-10-CM

## 2022-12-06 NOTE — Progress Notes (Signed)
EPIC Encounter for ICM Monitoring  Patient Name: [name] is a 77 y.o. female Date: 12/06/2022 Primary Care Physican: Patient, No Pcp Per Primary Cardiologist: Ladona Ridgel Electrophysiologist: Ladona Ridgel 03/05/2022 Office Weight: 103 lbs   Transmission reviewed.   Optivol thoracic impedance suggesting normal fluid levels.   Prescribed: No diuretic   Recommendations:  None   Follow-up plan: ICM clinic phone appointment on 01/06/2023.   91 day device clinic remote transmission 01/27/2023.         EP/Cardiology Office Visits:   02/25/2023 with Dr. Ladona Ridgel.     Copy of ICM check sent to Dr. Ladona Ridgel.    3 month ICM trend: 12/02/2022.    12-14 Month ICM trend:     Karie Soda, RN 12/06/2022 2:37 PM

## 2023-01-06 ENCOUNTER — Ambulatory Visit: Payer: 59 | Attending: Internal Medicine

## 2023-01-06 DIAGNOSIS — Z95810 Presence of automatic (implantable) cardiac defibrillator: Secondary | ICD-10-CM

## 2023-01-06 DIAGNOSIS — I5022 Chronic systolic (congestive) heart failure: Secondary | ICD-10-CM

## 2023-01-08 NOTE — Progress Notes (Signed)
EPIC Encounter for ICM Monitoring  Patient Name: [name] is a 77 y.o. female Date: 01/08/2023 Primary Care Physican: Patient, No Pcp Per Primary Cardiologist: Ladona Ridgel Electrophysiologist: Ladona Ridgel 03/05/2022 Office Weight: 103 lbs   Transmission reviewed.   Optivol thoracic impedance suggesting normal fluid levels.   Prescribed: No diuretic   Recommendations:  None   Follow-up plan: ICM clinic phone appointment on 02/10/2023.   91 day device clinic remote transmission 01/27/2023.         EP/Cardiology Office Visits:   02/25/2023 with Dr. Ladona Ridgel.     Copy of ICM check sent to Dr. Ladona Ridgel.    3 month ICM trend: 01/06/2023.    12-14 Month ICM trend:     Karie Soda, RN 01/08/2023 8:36 AM

## 2023-01-27 ENCOUNTER — Ambulatory Visit (INDEPENDENT_AMBULATORY_CARE_PROVIDER_SITE_OTHER): Payer: 59

## 2023-01-27 DIAGNOSIS — I255 Ischemic cardiomyopathy: Secondary | ICD-10-CM

## 2023-01-27 DIAGNOSIS — I5022 Chronic systolic (congestive) heart failure: Secondary | ICD-10-CM

## 2023-02-10 ENCOUNTER — Ambulatory Visit: Payer: 59 | Attending: Internal Medicine

## 2023-02-10 DIAGNOSIS — Z95810 Presence of automatic (implantable) cardiac defibrillator: Secondary | ICD-10-CM

## 2023-02-10 DIAGNOSIS — I5022 Chronic systolic (congestive) heart failure: Secondary | ICD-10-CM

## 2023-02-10 NOTE — Progress Notes (Signed)
Remote ICD transmission.   

## 2023-02-12 NOTE — Progress Notes (Signed)
EPIC Encounter for ICM Monitoring  Patient Name: [name] is a 77 y.o. female Date: 02/12/2023 Primary Care Physican: Patient, No Pcp Per Primary Cardiologist: Ladona Ridgel Electrophysiologist: Ladona Ridgel 03/05/2022 Office Weight: 103 lbs   Transmission reviewed.   Optivol thoracic impedance suggesting possible fluid accumulation starting 8/21 and trending back toward baseline.   Prescribed: No diuretic   Recommendations:  None   Follow-up plan: ICM clinic phone appointment on 02/18/2023 to recheck fluid levels.   91 day device clinic remote transmission 01/27/2023.         EP/Cardiology Office Visits:   02/25/2023 with Dr. Ladona Ridgel.     Copy of ICM check sent to Dr. Ladona Ridgel.    3 month ICM trend: 02/10/2023.    12-14 Month ICM trend:     Karie Soda, RN 02/12/2023 4:35 PM

## 2023-02-18 ENCOUNTER — Ambulatory Visit: Payer: 59 | Attending: Internal Medicine

## 2023-02-18 DIAGNOSIS — I5022 Chronic systolic (congestive) heart failure: Secondary | ICD-10-CM

## 2023-02-18 DIAGNOSIS — Z95810 Presence of automatic (implantable) cardiac defibrillator: Secondary | ICD-10-CM

## 2023-02-20 NOTE — Progress Notes (Signed)
EPIC Encounter for ICM Monitoring  Patient Name: [name] is a 77 y.o. female Date: 02/20/2023 Primary Care Physican: Patient, No Pcp Per Primary Cardiologist: Ladona Ridgel Electrophysiologist: Ladona Ridgel 03/05/2022 Office Weight: 103 lbs   Transmission reviewed.   Optivol thoracic impedance suggesting fluid levels returned close to baseline.   Prescribed: No diuretic   Recommendations:  None   Follow-up plan: ICM clinic phone appointment on 03/24/2023.   91 day device clinic remote transmission 04/28/2023.         EP/Cardiology Office Visits:   02/25/2023 with Dr. Ladona Ridgel.     Copy of ICM check sent to Dr. Ladona Ridgel.    3 month ICM trend: 02/18/2023.    12-14 Month ICM trend:     Karie Soda, RN 02/20/2023 3:07 PM

## 2023-02-25 ENCOUNTER — Encounter: Payer: Self-pay | Admitting: Internal Medicine

## 2023-02-25 ENCOUNTER — Ambulatory Visit: Payer: 59 | Attending: Internal Medicine | Admitting: Internal Medicine

## 2023-02-25 VITALS — BP 120/70 | HR 72 | Ht 60.0 in | Wt 101.8 lb

## 2023-02-25 DIAGNOSIS — I5022 Chronic systolic (congestive) heart failure: Secondary | ICD-10-CM | POA: Diagnosis not present

## 2023-02-25 NOTE — Patient Instructions (Signed)

## 2023-02-25 NOTE — Progress Notes (Signed)
HPI [name] returns today for followup. She is a pleast 77 yo woman with an ICM, s/p remote MI, class 2 CHF, prior stroke with left HP, s/p ICD insertion. She has developed swelling in the right arm. She had an ultrasound which did not show a DVT. She denies any trauma. No chest pain or sob. No edema in the legs.   Allergies  Allergen Reactions   Beta Adrenergic Blockers Other (See Comments)    fatigue     Current Outpatient Medications  Medication Sig Dispense Refill   alendronate (FOSAMAX) 70 MG tablet Take 1 tablet by mouth once a week.     amLODipine (NORVASC) 5 MG tablet Take 5 mg by mouth daily.  6   carvedilol (COREG) 3.125 MG tablet Take 3.125 mg by mouth 2 (two) times daily.  0   Cholecalciferol (VITAMIN D) 2000 UNITS CAPS Take 1 capsule by mouth daily.     clopidogrel (PLAVIX) 75 MG tablet Take 75 mg by mouth daily.     rosuvastatin (CRESTOR) 20 MG tablet Take 20 mg by mouth daily.     tetrahydrozoline 0.05 % ophthalmic solution Place 1 drop into both eyes 2 (two) times daily.     No current facility-administered medications for this visit.     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 Dx 05/2005;  b. 10/2013 Echo: EF 25-30%, DK and scarring of mid-distal antsept, inf, infsept, & apical myocardium. Severe HK of anterior wall. Gr 1 DD.   CVA (cerebral vascular accident) ([facility]) 2002, 2008, "mini-stroke"  Oct 2016   Dr. Heide Spark; "LUE very weak; left leg a little weak but can still walk"    Deafness in left ear    Depression    Dyslipidemia    Hypertension    Ischemic cardiomyopathy December 2006   a. 10/2013 Echo: EF 25-30%;  b. 11/2013 MDT single Lead ICD gen change and lead revision.   Uterine fibroid     ROS:   All systems reviewed and negative except as noted in the  HPI.   Past Surgical History:  Procedure Laterality Date   ABDOMINAL HYSTERECTOMY     "partial"   APPENDECTOMY     CARDIAC DEFIBRILLATOR PLACEMENT  05/2005; 11/19/2013   MDT ICD implanted 2006 by Dr Ladona Ridgel with [id number] lead; gen change and RV lead revision 11/2013 by Dr Ladona Ridgel   CHOLECYSTECTOMY     IMPLANTABLE CARDIOVERTER DEFIBRILLATOR REVISION N/A 11/19/2013   Procedure: IMPLANTABLE CARDIOVERTER DEFIBRILLATOR REVISION;  Surgeon: Marinus Maw, MD;  Location: [facility] CATH LAB;  Service: Cardiovascular;  Laterality: N/A;   TONSILLECTOMY       Family History  Problem Relation Age of Onset   Heart disease Mother 3       Cerebrovascular Accident   Mental retardation Sister      Social History   Socioeconomic History   Marital status: Legally Separated    Spouse name: Not on file   Number of children: Not on file   Years of education: Not on file   Highest education level: Not on file  Occupational History   Occupation: disabled  Tobacco Use   Smoking status: Former    Current packs/day: 0.00    Average packs/day: 0.1 packs/day for 42.0 years (4.2 ttl pk-yrs)  Types: Cigarettes    Start date: 06/17/1965    Quit date: 06/18/2007    Years since quitting: 15.7   Smokeless tobacco: Never  Vaping Use   Vaping status: Never Used  Substance and Sexual Activity   Alcohol use: No    Alcohol/week: 0.0 standard drinks of alcohol    Comment: "stopped drinking alcohol in the 1990's"   Drug use: No    Types: Cocaine    Comment: "stopped using drugs "before 2009"   Sexual activity: Never  Other Topics Concern   Not on file  Social History Narrative   Not on file   Social Determinants of Health   Financial Resource Strain: Not on file  Food Insecurity: Not on file  Transportation Needs: Not on file  Physical Activity: Not on file  Stress: Not on file  Social Connections: Not on file  Intimate Partner Violence: Not on file     BP 120/70   Pulse 72   Ht 5' (1.524 m)   Wt 101 lb  12.8 oz (46.2 kg)   BMI 19.88 kg/m   Physical Exam:  Well appearing NAD HEENT: Unremarkable Neck:  No JVD, no thyromegally Lymphatics:  No adenopathy Back:  No CVA tenderness Lungs:  Clear with no wheezes HEART:  Regular rate rhythm, no murmurs, no rubs, no clicks Abd:  soft, positive bowel sounds, no organomegally, no rebound, no guarding Ext:  2 plus pulses, no edema, no cyanosis, no clubbing Skin:  No rashes no nodules Neuro:  CN II through XII intact, motor grossly intact  DEVICE  Normal device function.  See PaceArt for details.   Assess/Plan:  ICD - her device is working normally. She has 1.9 years of battery longevity. Chronic systolic heart failure - she has class 2 symptoms. No change in her meds Right arm swelling. I explained the importance of keeping her left arm elevated. She will remain on plavix. CAD - she is s/p remote MI. We will follow.  Tiffany Gowda Annayah Worthley,MD

## 2023-03-10 LAB — CUP PACEART INCLINIC DEVICE CHECK
Battery Remaining Longevity: 51 mo
Battery Voltage: 2.95 V
Brady Statistic AP VP Percent: 94.64 %
Brady Statistic AP VS Percent: 0.01 %
Brady Statistic AS VP Percent: 5.29 %
Brady Statistic AS VS Percent: 0.06 %
Brady Statistic RA Percent Paced: 94.63 %
Brady Statistic RV Percent Paced: 99.92 %
Date Time Interrogation Session: 20240910145300
Implantable Lead Connection Status: 753985
Implantable Lead Implant Date: 20150605
Implantable Lead Location: 753860
Implantable Pulse Generator Implant Date: 20150605
Lead Channel Impedance Value: 323 Ohm
Lead Channel Impedance Value: 418 Ohm
Lead Channel Impedance Value: 513 Ohm
Lead Channel Impedance Value: 684 Ohm
Lead Channel Pacing Threshold Amplitude: 0.5 V
Lead Channel Pacing Threshold Amplitude: 0.5 V
Lead Channel Pacing Threshold Pulse Width: 0.4 ms
Lead Channel Pacing Threshold Pulse Width: 0.4 ms
Lead Channel Sensing Intrinsic Amplitude: 1.875 mV
Lead Channel Sensing Intrinsic Amplitude: 15 mV
Lead Channel Sensing Intrinsic Amplitude: 2.625 mV
Lead Channel Sensing Intrinsic Amplitude: 28.25 mV
Lead Channel Setting Pacing Amplitude: 3.5 V
Lead Channel Setting Pacing Amplitude: 3.5 V
Lead Channel Setting Pacing Pulse Width: 0.4 ms
Lead Channel Setting Sensing Sensitivity: 1.2 mV
Zone Setting Status: 755011
Zone Setting Status: 755011

## 2023-03-24 ENCOUNTER — Ambulatory Visit: Payer: 59 | Attending: Internal Medicine

## 2023-03-24 DIAGNOSIS — Z95810 Presence of automatic (implantable) cardiac defibrillator: Secondary | ICD-10-CM

## 2023-03-24 DIAGNOSIS — I5022 Chronic systolic (congestive) heart failure: Secondary | ICD-10-CM | POA: Diagnosis not present

## 2023-04-01 NOTE — Progress Notes (Signed)
EPIC Encounter for ICM Monitoring  Patient Name: [name] is a 77 y.o. female Date: 04/01/2023 Primary Care Physican: System, Provider Not In Primary Cardiologist: Ladona Ridgel Electrophysiologist: Ladona Ridgel 03/05/2022 Office Weight: 103 lbs   Transmission reviewed.   Optivol thoracic impedance suggesting possible fluid accumulation starting 9/16 returned close to baseline 10/5.   Prescribed: No diuretic   Recommendations:  None   Follow-up plan: ICM clinic phone appointment on 04/29/2023.   91 day device clinic remote transmission 04/28/2023.         EP/Cardiology Office Visits:   Recall 02/20/2024 with Dr. Ladona Ridgel.     Copy of ICM check sent to Dr. Ladona Ridgel.    3 month ICM trend: 03/24/2023.    12-14 Month ICM trend:     Karie Soda, RN 04/01/2023 1:51 PM

## 2023-04-28 ENCOUNTER — Ambulatory Visit (INDEPENDENT_AMBULATORY_CARE_PROVIDER_SITE_OTHER): Payer: 59

## 2023-04-28 DIAGNOSIS — I255 Ischemic cardiomyopathy: Secondary | ICD-10-CM | POA: Diagnosis not present

## 2023-04-28 DIAGNOSIS — I5022 Chronic systolic (congestive) heart failure: Secondary | ICD-10-CM

## 2023-04-29 ENCOUNTER — Ambulatory Visit: Payer: 59 | Attending: Internal Medicine

## 2023-04-29 DIAGNOSIS — I5022 Chronic systolic (congestive) heart failure: Secondary | ICD-10-CM | POA: Diagnosis not present

## 2023-04-29 DIAGNOSIS — Z95810 Presence of automatic (implantable) cardiac defibrillator: Secondary | ICD-10-CM | POA: Diagnosis not present

## 2023-04-29 LAB — CUP PACEART REMOTE DEVICE CHECK
Battery Remaining Longevity: 22 mo
Battery Voltage: 2.93 V
Brady Statistic RV Percent Paced: 0.01 %
Date Time Interrogation Session: 20241111061705
HighPow Impedance: 46 Ohm
Implantable Lead Connection Status: 753985
Implantable Lead Implant Date: 20150605
Implantable Lead Location: 753860
Implantable Pulse Generator Implant Date: 20150605
Lead Channel Impedance Value: 285 Ohm
Lead Channel Impedance Value: 361 Ohm
Lead Channel Pacing Threshold Amplitude: 0.75 V
Lead Channel Pacing Threshold Pulse Width: 0.4 ms
Lead Channel Sensing Intrinsic Amplitude: 5.25 mV
Lead Channel Sensing Intrinsic Amplitude: 5.25 mV
Lead Channel Setting Pacing Amplitude: 2.5 V
Lead Channel Setting Pacing Pulse Width: 0.4 ms
Lead Channel Setting Sensing Sensitivity: 0.3 mV
Zone Setting Status: 755011

## 2023-05-06 NOTE — Progress Notes (Signed)
EPIC Encounter for ICM Monitoring  Patient Name: [name] is a 77 y.o. female Date: 05/06/2023 Primary Care Physican: System, Provider Not In Primary Cardiologist: Ladona Ridgel Electrophysiologist: Ladona Ridgel 03/05/2022 Office Weight: 103 lbs   Transmission reviewed.   Optivol thoracic impedance suggesting normal fluid levels since 10/16.   Prescribed: No diuretic   Recommendations:  None   Follow-up plan: ICM clinic phone appointment on 06/09/2023.   91 day device clinic remote transmission 07/28/2023.         EP/Cardiology Office Visits:   Recall 02/20/2024 with Dr. Ladona Ridgel.     Copy of ICM check sent to Dr. Ladona Ridgel.    3 month ICM trend: 05/05/2023.    12-14 Month ICM trend:     Karie Soda, RN 05/06/2023 4:22 PM

## 2023-05-21 NOTE — Progress Notes (Signed)
Remote ICD transmission.   

## 2023-06-09 ENCOUNTER — Ambulatory Visit: Payer: 59 | Attending: Internal Medicine

## 2023-06-09 DIAGNOSIS — Z95810 Presence of automatic (implantable) cardiac defibrillator: Secondary | ICD-10-CM | POA: Diagnosis not present

## 2023-06-09 DIAGNOSIS — I5022 Chronic systolic (congestive) heart failure: Secondary | ICD-10-CM | POA: Diagnosis not present

## 2023-06-10 NOTE — Progress Notes (Signed)
EPIC Encounter for ICM Monitoring  Patient Name: [name] is a 77 y.o. female Date: 06/10/2023 Primary Care Physican: System, Provider Not In Primary Cardiologist: Ladona Ridgel Electrophysiologist: Ladona Ridgel 03/05/2022 Office Weight: 103 lbs   Spoke with sister and heart failure questions reviewed.  Transmission results reviewed.  She reports patient has not complained of any fluid symptoms.  She reports patient does eat salty snacks such as potato chips.      Optivol thoracic impedance suggesting possible fluid accumulation starting 12/14.   Prescribed: No diuretic   Recommendations:  Encouraged to have patient limit salt snack foods.    Follow-up plan: ICM clinic phone appointment on 06/23/2023 to recheck fluid levels.   91 day device clinic remote transmission 07/28/2023.         EP/Cardiology Office Visits:   Recall 02/20/2024 with Dr. Ladona Ridgel.     Copy of ICM check sent to Dr. Ladona Ridgel.    3 month ICM trend: 06/09/2023.    12-14 Month ICM trend:     Karie Soda, RN 06/10/2023 10:28 AM

## 2023-06-23 ENCOUNTER — Ambulatory Visit: Payer: 59 | Attending: Internal Medicine

## 2023-06-23 DIAGNOSIS — I5022 Chronic systolic (congestive) heart failure: Secondary | ICD-10-CM

## 2023-06-23 DIAGNOSIS — Z95810 Presence of automatic (implantable) cardiac defibrillator: Secondary | ICD-10-CM

## 2023-06-25 NOTE — Progress Notes (Signed)
 EPIC Encounter for ICM Monitoring  Patient Name: [name] is a 78 y.o. female Date: 06/25/2023 Primary Care Physican: System, Provider Not In Primary Cardiologist: Waddell Electrophysiologist: Waddell 03/05/2022 Office Weight: 103 lbs   Transmission results reviewed.    Optivol thoracic impedance suggesting fluid levels returned to normal.   Prescribed: No diuretic   Recommendations:  No changes.   Follow-up plan: ICM clinic phone appointment on 07/21/2023.   91 day device clinic remote transmission 07/28/2023.         EP/Cardiology Office Visits:   Recall 02/20/2024 with Dr. Waddell.     Copy of ICM check sent to Dr. Waddell.    3 month ICM trend: 06/23/2023.    12-14 Month ICM trend:     [name], RN 06/25/2023 2:32 PM

## 2023-07-21 ENCOUNTER — Ambulatory Visit: Payer: 59 | Attending: Internal Medicine

## 2023-07-21 DIAGNOSIS — I5022 Chronic systolic (congestive) heart failure: Secondary | ICD-10-CM | POA: Diagnosis not present

## 2023-07-21 DIAGNOSIS — Z95810 Presence of automatic (implantable) cardiac defibrillator: Secondary | ICD-10-CM

## 2023-07-28 ENCOUNTER — Ambulatory Visit (INDEPENDENT_AMBULATORY_CARE_PROVIDER_SITE_OTHER): Payer: 59

## 2023-07-28 DIAGNOSIS — I255 Ischemic cardiomyopathy: Secondary | ICD-10-CM

## 2023-07-28 DIAGNOSIS — I5022 Chronic systolic (congestive) heart failure: Secondary | ICD-10-CM

## 2023-07-29 LAB — CUP PACEART REMOTE DEVICE CHECK
Battery Remaining Longevity: 18 mo
Battery Voltage: 2.93 V
Brady Statistic RV Percent Paced: 0 %
Date Time Interrogation Session: 20250210033324
HighPow Impedance: 46 Ohm
Implantable Lead Connection Status: 753985
Implantable Lead Implant Date: 20150605
Implantable Lead Location: 753860
Implantable Pulse Generator Implant Date: 20150605
Lead Channel Impedance Value: 285 Ohm
Lead Channel Impedance Value: 342 Ohm
Lead Channel Pacing Threshold Amplitude: 0.875 V
Lead Channel Pacing Threshold Pulse Width: 0.4 ms
Lead Channel Sensing Intrinsic Amplitude: 5.875 mV
Lead Channel Sensing Intrinsic Amplitude: 5.875 mV
Lead Channel Setting Pacing Amplitude: 2.5 V
Lead Channel Setting Pacing Pulse Width: 0.4 ms
Lead Channel Setting Sensing Sensitivity: 0.3 mV
Zone Setting Status: 755011

## 2023-09-01 ENCOUNTER — Ambulatory Visit: Attending: Internal Medicine

## 2023-09-01 DIAGNOSIS — I5022 Chronic systolic (congestive) heart failure: Secondary | ICD-10-CM

## 2023-09-01 DIAGNOSIS — Z95810 Presence of automatic (implantable) cardiac defibrillator: Secondary | ICD-10-CM | POA: Diagnosis not present

## 2023-09-01 NOTE — Addendum Note (Signed)
 Addended by: Geralyn Flash D on: 09/01/2023 01:29 PM   Modules accepted: Orders

## 2023-09-01 NOTE — Progress Notes (Signed)
 Remote ICD transmission.

## 2023-09-02 ENCOUNTER — Telehealth: Payer: Self-pay

## 2023-09-02 NOTE — Telephone Encounter (Signed)
 Remote ICM transmission received.  Attempted call to sister [name] per DPR regarding ICM remote transmission and no answer.

## 2023-09-02 NOTE — Progress Notes (Signed)
 EPIC Encounter for ICM Monitoring  Patient Name: [name] is a 78 y.o. female Date: 09/02/2023 Primary Care Physican: System, Provider Not In Primary Cardiologist: Ladona Ridgel Electrophysiologist: Ladona Ridgel 03/05/2022 Office Weight: 103 lbs   Attempted call to sister Marrianne Mood and unable to reach.   Transmission results reviewed.     Optivol thoracic impedance suggesting possible fluid accumulation starting 2/12 but trending back toward baseline.   Prescribed: No diuretic   Recommendations:  Unable to reach.     Follow-up plan: ICM clinic phone appointment on 09/15/2023 to recheck fluid levels.   91 day device clinic remote transmission 10/27/2023.         EP/Cardiology Office Visits:   Recall 02/20/2024 with Dr. Ladona Ridgel.     Copy of ICM check sent to Dr. Ladona Ridgel.    3 month ICM trend: 09/01/2023.    12-14 Month ICM trend:     Karie Soda, RN 09/02/2023 8:07 AM

## 2023-09-15 ENCOUNTER — Ambulatory Visit: Attending: Internal Medicine

## 2023-09-15 DIAGNOSIS — I5022 Chronic systolic (congestive) heart failure: Secondary | ICD-10-CM

## 2023-09-15 DIAGNOSIS — Z95810 Presence of automatic (implantable) cardiac defibrillator: Secondary | ICD-10-CM

## 2023-09-16 NOTE — Progress Notes (Signed)
 EPIC Encounter for ICM Monitoring  Patient Name: [name] is a 78 y.o. female Date: 09/16/2023 Primary Care Physican: System, Provider Not In Primary Cardiologist: Ladona Ridgel Electrophysiologist: Ladona Ridgel 03/05/2022 Office Weight: 103 lbs   Transmission results reviewed.     Optivol thoracic impedance suggesting fluid levels returned to normal on 3/25.   Prescribed: No diuretic   Recommendations:  No changes.   Follow-up plan: ICM clinic phone appointment on 10/06/2023 to recheck fluid levels.   91 day device clinic remote transmission 10/27/2023.         EP/Cardiology Office Visits:   Recall 02/20/2024 with Dr. Ladona Ridgel.     Copy of ICM check sent to Dr. Ladona Ridgel.    3 month ICM trend: 09/15/2023.    12-14 Month ICM trend:     Karie Soda, RN 09/16/2023 3:33 PM

## 2023-10-03 NOTE — Progress Notes (Signed)
 ICM remote transmission rescheduled for 10/20/2023.

## 2023-10-06 ENCOUNTER — Encounter

## 2023-10-20 ENCOUNTER — Ambulatory Visit: Attending: Internal Medicine

## 2023-10-20 DIAGNOSIS — Z95810 Presence of automatic (implantable) cardiac defibrillator: Secondary | ICD-10-CM | POA: Diagnosis not present

## 2023-10-20 DIAGNOSIS — I5022 Chronic systolic (congestive) heart failure: Secondary | ICD-10-CM

## 2023-10-23 NOTE — Progress Notes (Signed)
 EPIC Encounter for ICM Monitoring  Patient Name: [name] is a 78 y.o. female Date: 10/23/2023 Primary Care Physican: System, Provider Not In Primary Cardiologist: Carolynne Citron Electrophysiologist: Carolynne Citron 03/05/2022 Office Weight: 103 lbs   Transmission results reviewed.     Optivol thoracic impedance suggesting normal fluid levels since 4/7.   Prescribed: No diuretic   Recommendations:  No changes.   Follow-up plan: ICM clinic phone appointment on 11/24/2023.   91 day device clinic remote transmission 10/27/2023.         EP/Cardiology Office Visits:   Recall 02/20/2024 with Dr. Carolynne Citron.     Copy of ICM check sent to Dr. Carolynne Citron.     3 month ICM trend: 10/20/2023.    12-14 Month ICM trend:     Tiffany Jain, RN 10/23/2023 10:37 AM

## 2023-10-27 ENCOUNTER — Ambulatory Visit (INDEPENDENT_AMBULATORY_CARE_PROVIDER_SITE_OTHER): Payer: 59

## 2023-10-27 DIAGNOSIS — I5022 Chronic systolic (congestive) heart failure: Secondary | ICD-10-CM | POA: Diagnosis not present

## 2023-10-27 DIAGNOSIS — I255 Ischemic cardiomyopathy: Secondary | ICD-10-CM

## 2023-10-28 LAB — CUP PACEART REMOTE DEVICE CHECK
Battery Remaining Longevity: 19 mo
Battery Voltage: 2.92 V
Brady Statistic RV Percent Paced: 0.01 %
Date Time Interrogation Session: 20250512073726
HighPow Impedance: 46 Ohm
Implantable Lead Connection Status: 753985
Implantable Lead Implant Date: 20150605
Implantable Lead Location: 753860
Implantable Pulse Generator Implant Date: 20150605
Lead Channel Impedance Value: 285 Ohm
Lead Channel Impedance Value: 342 Ohm
Lead Channel Pacing Threshold Amplitude: 1 V
Lead Channel Pacing Threshold Pulse Width: 0.4 ms
Lead Channel Sensing Intrinsic Amplitude: 10 mV
Lead Channel Sensing Intrinsic Amplitude: 10 mV
Lead Channel Setting Pacing Amplitude: 2.5 V
Lead Channel Setting Pacing Pulse Width: 0.4 ms
Lead Channel Setting Sensing Sensitivity: 0.3 mV
Zone Setting Status: 755011

## 2023-11-03 ENCOUNTER — Ambulatory Visit: Payer: Self-pay | Admitting: Internal Medicine

## 2023-11-24 ENCOUNTER — Ambulatory Visit: Attending: Internal Medicine

## 2023-11-24 DIAGNOSIS — I5022 Chronic systolic (congestive) heart failure: Secondary | ICD-10-CM

## 2023-11-24 DIAGNOSIS — Z95810 Presence of automatic (implantable) cardiac defibrillator: Secondary | ICD-10-CM

## 2023-11-26 NOTE — Progress Notes (Signed)
 EPIC Encounter for ICM Monitoring  Patient Name: [name] is a 78 y.o. female Date: 11/26/2023 Primary Care Physican: System, Provider Not In Primary Cardiologist: Carolynne Citron Electrophysiologist: Carolynne Citron 02/25/2023 Office Weight: 101 lbs   Transmission results reviewed.     Optivol thoracic impedance suggesting normal fluid levels within the last month   Prescribed: No diuretic   Recommendations:  No changes.   Follow-up plan: ICM clinic phone appointment on 01/12/2024.   91 day device clinic remote transmission 01/26/2024.         EP/Cardiology Office Visits:   Recall 02/20/2024 with Dr. Carolynne Citron.     Copy of ICM check sent to Dr. Carolynne Citron.     3 month ICM trend: 11/24/2023.    12-14 Month ICM trend:     Almyra Jain, RN 11/26/2023 2:56 PM

## 2023-12-10 NOTE — Addendum Note (Signed)
 Addended by: TAWNI DRILLING D on: 12/10/2023 03:05 PM   Modules accepted: Orders

## 2023-12-10 NOTE — Progress Notes (Signed)
 Remote ICD transmission.

## 2024-01-12 ENCOUNTER — Ambulatory Visit: Attending: Internal Medicine

## 2024-01-12 DIAGNOSIS — I5022 Chronic systolic (congestive) heart failure: Secondary | ICD-10-CM

## 2024-01-12 DIAGNOSIS — Z95810 Presence of automatic (implantable) cardiac defibrillator: Secondary | ICD-10-CM | POA: Diagnosis not present

## 2024-01-16 NOTE — Progress Notes (Signed)
 EPIC Encounter for ICM Monitoring  Patient Name: [name] is a 78 y.o. female Date: 01/16/2024 Primary Care Physican: System, Provider Not In Primary Cardiologist: Waddell Electrophysiologist: Waddell 02/25/2023 Office Weight: 101 lbs   Transmission results reviewed.     Optivol thoracic impedance suggesting normal fluid levels with the exception of possible fluid accumulation from 7/15-7/27.   Prescribed: No diuretic   Recommendations:  No changes.   Follow-up plan: ICM clinic phone appointment on 02/17/2024.   91 day device clinic remote transmission 01/26/2024.         EP/Cardiology Office Visits:   Recall 02/20/2024 with Dr. Waddell.     Copy of ICM check sent to Dr. Waddell.     3 month ICM trend: 01/12/2024.    12-14 Month ICM trend:     [name], RN 01/16/2024 9:26 AM

## 2024-01-26 ENCOUNTER — Ambulatory Visit: Payer: 59

## 2024-01-26 DIAGNOSIS — I5022 Chronic systolic (congestive) heart failure: Secondary | ICD-10-CM | POA: Diagnosis not present

## 2024-01-27 LAB — CUP PACEART REMOTE DEVICE CHECK
Battery Remaining Longevity: 16 mo
Battery Voltage: 2.91 V
Brady Statistic RV Percent Paced: 0.01 %
Date Time Interrogation Session: 20250811093426
HighPow Impedance: 45 Ohm
Implantable Lead Connection Status: 753985
Implantable Lead Implant Date: 20150605
Implantable Lead Location: 753860
Implantable Pulse Generator Implant Date: 20150605
Lead Channel Impedance Value: 304 Ohm
Lead Channel Impedance Value: 361 Ohm
Lead Channel Pacing Threshold Amplitude: 1.125 V
Lead Channel Pacing Threshold Pulse Width: 0.4 ms
Lead Channel Sensing Intrinsic Amplitude: 9 mV
Lead Channel Sensing Intrinsic Amplitude: 9 mV
Lead Channel Setting Pacing Amplitude: 2.5 V
Lead Channel Setting Pacing Pulse Width: 0.4 ms
Lead Channel Setting Sensing Sensitivity: 0.3 mV
Zone Setting Status: 755011

## 2024-01-28 ENCOUNTER — Ambulatory Visit: Payer: Self-pay | Admitting: Internal Medicine

## 2024-02-17 ENCOUNTER — Ambulatory Visit: Attending: Internal Medicine

## 2024-02-17 DIAGNOSIS — Z95810 Presence of automatic (implantable) cardiac defibrillator: Secondary | ICD-10-CM | POA: Diagnosis not present

## 2024-02-17 DIAGNOSIS — I5022 Chronic systolic (congestive) heart failure: Secondary | ICD-10-CM

## 2024-02-18 NOTE — Progress Notes (Signed)
 EPIC Encounter for ICM Monitoring  Patient Name: [name] is a 78 y.o. female Date: 02/18/2024 Primary Care Physican: System, Provider Not In Primary Cardiologist: Waddell Electrophysiologist: Waddell 02/25/2023 Office Weight: 101 lbs   Transmission results reviewed.     Optivol thoracic impedance suggesting intermittent days with possible fluid accumulation within the last month.   Prescribed: No diuretic   Recommendations:  No changes.   Follow-up plan: ICM clinic phone appointment on 10/32/2025.   91 day device clinic remote transmission 04/26/2024.         EP/Cardiology Office Visits:   Recall 02/20/2024 with Dr. Waddell.     Copy of ICM check sent to Dr. Waddell.     3 month ICM trend: 02/17/2024.    12-14 Month ICM trend:     [name], RN 02/18/2024 5:19 PM

## 2024-03-12 NOTE — Progress Notes (Signed)
Remote ICD Transmission.

## 2024-03-29 ENCOUNTER — Ambulatory Visit: Attending: Internal Medicine

## 2024-03-29 DIAGNOSIS — Z95810 Presence of automatic (implantable) cardiac defibrillator: Secondary | ICD-10-CM | POA: Diagnosis not present

## 2024-03-29 DIAGNOSIS — I5022 Chronic systolic (congestive) heart failure: Secondary | ICD-10-CM | POA: Diagnosis not present

## 2024-04-02 NOTE — Progress Notes (Signed)
 EPIC Encounter for ICM Monitoring  Patient Name: [name] is a 78 y.o. female Date: 04/02/2024 Primary Care Physican: System, Provider Not In Primary Cardiologist: Waddell Electrophysiologist: Waddell 02/25/2023 Office Weight: 101 lbs   Transmission results reviewed.     Since 02/17/2024 ICM Remote Transmission: Optivol thoracic impedance suggesting normal fluid levels.   Prescribed: No diuretic   Recommendations:  No changes.   Follow-up plan: ICM clinic phone appointment on 05/03/2024.   91 day device clinic remote transmission 04/26/2024.         EP/Cardiology Office Visits:   04/16/2024 with Dr. Waddell.     Copy of ICM check sent to Dr. Waddell.    Remote monitoring is medically necessary for Heart Failure Management.    Daily Thoracic Impedance ICM trend: 12/29/2023 through 03/29/2024.    12-14 Month Thoracic Impedance ICM trend:     [name], RN 04/02/2024 11:40 AM

## 2024-04-16 ENCOUNTER — Encounter: Payer: Self-pay | Admitting: Internal Medicine

## 2024-04-16 ENCOUNTER — Ambulatory Visit: Attending: Internal Medicine | Admitting: Internal Medicine

## 2024-04-16 VITALS — BP 150/92 | HR 84 | Ht 61.0 in | Wt 108.0 lb

## 2024-04-16 DIAGNOSIS — Z95810 Presence of automatic (implantable) cardiac defibrillator: Secondary | ICD-10-CM

## 2024-04-16 DIAGNOSIS — I5022 Chronic systolic (congestive) heart failure: Secondary | ICD-10-CM

## 2024-04-16 LAB — CUP PACEART INCLINIC DEVICE CHECK
Battery Remaining Longevity: 13 mo
Battery Voltage: 2.9 V
Brady Statistic RV Percent Paced: 0.01 %
Date Time Interrogation Session: 20251031162028
HighPow Impedance: 52 Ohm
Implantable Lead Connection Status: 753985
Implantable Lead Implant Date: 20150605
Implantable Lead Location: 753860
Implantable Pulse Generator Implant Date: 20150605
Lead Channel Impedance Value: 304 Ohm
Lead Channel Impedance Value: 361 Ohm
Lead Channel Pacing Threshold Amplitude: 0.75 V
Lead Channel Pacing Threshold Amplitude: 1 V
Lead Channel Pacing Threshold Pulse Width: 0.4 ms
Lead Channel Pacing Threshold Pulse Width: 0.4 ms
Lead Channel Sensing Intrinsic Amplitude: 14.5 mV
Lead Channel Sensing Intrinsic Amplitude: 8.875 mV
Lead Channel Setting Pacing Amplitude: 2.5 V
Lead Channel Setting Pacing Pulse Width: 0.4 ms
Lead Channel Setting Sensing Sensitivity: 0.3 mV
Zone Setting Status: 755011

## 2024-04-16 NOTE — Progress Notes (Addendum)
 HPI s. [name] returns today for followup. She is a pleast 78 yo woman with an ICM, s/p remote MI, class 2 CHF, prior stroke with left HP, s/p ICD insertion. She has developed swelling in the right arm. She had an ultrasound which did not show a DVT. She denies any trauma. No chest pain or sob. No edema in the legs.  Since I saw her last, no worsening swelling.  Allergies  Allergen Reactions   Beta Adrenergic Blockers Other (See Comments)    fatigue     Current Outpatient Medications  Medication Sig Dispense Refill   alendronate (FOSAMAX) 70 MG tablet Take 1 tablet by mouth once a week.     amLODipine (NORVASC) 5 MG tablet Take 5 mg by mouth daily.  6   carvedilol (COREG) 3.125 MG tablet Take 3.125 mg by mouth 2 (two) times daily.  0   Cholecalciferol (VITAMIN D) 2000 UNITS CAPS Take 1 capsule by mouth daily.     clopidogrel (PLAVIX) 75 MG tablet Take 75 mg by mouth daily.     rosuvastatin (CRESTOR) 20 MG tablet Take 20 mg by mouth daily.     tetrahydrozoline 0.05 % ophthalmic solution Place 1 drop into both eyes 2 (two) times daily.     No current facility-administered medications for this visit.     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 Dx 05/2005;  b. 10/2013 Echo: EF 25-30%, DK and scarring of mid-distal antsept, inf, infsept, & apical myocardium. Severe HK of anterior wall. Gr 1 DD.   CVA (cerebral vascular accident) (HCC) 2002, 2008, mini-stroke  Oct 2016   Dr. Hanley; LUE very weak; left leg a little weak but can still walk    Deafness in left ear    Depression    Dyslipidemia    Hypertension    Ischemic cardiomyopathy December 2006   a. 10/2013 Echo: EF 25-30%;  b. 11/2013 MDT single Lead ICD gen change and lead revision.   Uterine fibroid     ROS:   All systems reviewed and  negative except as noted in the HPI.   Past Surgical History:  Procedure Laterality Date   ABDOMINAL HYSTERECTOMY     partial   APPENDECTOMY     CARDIAC DEFIBRILLATOR PLACEMENT  05/2005; 11/19/2013   MDT ICD implanted 2006 by Dr Waddell with [id number] lead; gen change and RV lead revision 11/2013 by Dr Waddell   CHOLECYSTECTOMY     IMPLANTABLE CARDIOVERTER DEFIBRILLATOR REVISION N/A 11/19/2013   Procedure: IMPLANTABLE CARDIOVERTER DEFIBRILLATOR REVISION;  Surgeon: Danelle LELON Waddell, MD;  Location: [facility] C[facility];  Service: Cardiovascular;  Laterality: N/A;   TONSILLECTOMY       Family History  Problem Relation Age of Onset   Heart disease Mother 63       Cerebrovascular Accident   Mental retardation Sister      Social History   Socioeconomic History   Marital status: Legally Separated    Spouse name: Not on file   Number of children: Not on file   Years of education: Not on file   Highest education level: Not on file  Occupational History   Occupation: disabled  Tobacco Use   Smoking status: Former    Current packs/day: 0.00    Average packs/day: 0.1 packs/day  for 42.0 years (4.2 ttl pk-yrs)    Types: Cigarettes    Start date: 06/17/1965    Quit date: 06/18/2007    Years since quitting: 16.8   Smokeless tobacco: Never  Vaping Use   Vaping status: Never Used  Substance and Sexual Activity   Alcohol use: No    Alcohol/week: 0.0 standard drinks of alcohol    Comment: stopped drinking alcohol in the 1990's   Drug use: No    Types: Cocaine    Comment: stopped using drugs before 2009   Sexual activity: Never  Other Topics Concern   Not on file  Social History Narrative   Not on file   Social Drivers of Health   Financial Resource Strain: Not on file  Food Insecurity: Not on file  Transportation Needs: Not on file  Physical Activity: Not on file  Stress: Not on file  Social Connections: Not on file  Intimate Partner Violence: Not on file     BP (!) 150/92   Pulse 84    Ht 5' 1 (1.549 m)   Wt 108 lb (49 kg)   SpO2 97%   BMI 20.41 kg/m   Physical Exam:  Well appearing NAD HEENT: Unremarkable Neck:  No JVD, no thyromegally Lymphatics:  No adenopathy Back:  No CVA tenderness Lungs:  Clear with no wheezes HEART:  Regular rate rhythm, no murmurs, no rubs, no clicks; prominent LV heave Abd:  soft, positive bowel sounds, no organomegally, no rebound, no guarding Ext:  2 plus pulses, no edema, no cyanosis, no clubbing Skin:  No rashes no nodules Neuro:  CN II through XII intact, motor grossly intact  EKG - NSR with AS MI, PVC  DEVICE  Normal device function.  See PaceArt for details.   Assess/Plan:  ICD - her device is working normally. She has 1.1 years of battery longevity. Chronic systolic heart failure - she has class 2 symptoms. No change in her meds Right arm swelling. I explained the importance of keeping her right arm elevated. She will remain on plavix. CAD - she is s/p remote MI. We will follow. No angina. HTN - her bp is usually a little low. I encouraged her to check at home and if it remains high to call us  and we will uptitrate her coreg.    Danelle Kimberlye Dilger,MD

## 2024-04-16 NOTE — Patient Instructions (Signed)

## 2024-04-26 ENCOUNTER — Ambulatory Visit (INDEPENDENT_AMBULATORY_CARE_PROVIDER_SITE_OTHER): Payer: 59

## 2024-04-26 DIAGNOSIS — I5022 Chronic systolic (congestive) heart failure: Secondary | ICD-10-CM

## 2024-04-27 LAB — CUP PACEART REMOTE DEVICE CHECK
Battery Remaining Longevity: 15 mo
Battery Voltage: 2.91 V
Brady Statistic RV Percent Paced: 0 %
Date Time Interrogation Session: 20251110082603
HighPow Impedance: 45 Ohm
Implantable Lead Connection Status: 753985
Implantable Lead Implant Date: 20150605
Implantable Lead Location: 753860
Implantable Pulse Generator Implant Date: 20150605
Lead Channel Impedance Value: 247 Ohm
Lead Channel Impedance Value: 304 Ohm
Lead Channel Pacing Threshold Amplitude: 0.875 V
Lead Channel Pacing Threshold Pulse Width: 0.4 ms
Lead Channel Sensing Intrinsic Amplitude: 8.5 mV
Lead Channel Sensing Intrinsic Amplitude: 8.5 mV
Lead Channel Setting Pacing Amplitude: 2.5 V
Lead Channel Setting Pacing Pulse Width: 0.4 ms
Lead Channel Setting Sensing Sensitivity: 0.3 mV
Zone Setting Status: 755011

## 2024-04-29 NOTE — Progress Notes (Signed)
 Remote ICD Transmission

## 2024-04-30 ENCOUNTER — Ambulatory Visit: Attending: Internal Medicine

## 2024-04-30 DIAGNOSIS — I5022 Chronic systolic (congestive) heart failure: Secondary | ICD-10-CM

## 2024-04-30 DIAGNOSIS — Z95810 Presence of automatic (implantable) cardiac defibrillator: Secondary | ICD-10-CM

## 2024-04-30 NOTE — Progress Notes (Signed)
 EPIC Encounter for ICM Monitoring  Patient Name: [name] is a 78 y.o. female Date: 04/30/2024 Primary Care Physican: System, Provider Not In Primary Cardiologist: Waddell Electrophysiologist: Waddell 02/25/2023 Office Weight: 101 lbs 04/16/2024 Office Weight: 108 lbs   Transmission results reviewed.     Since 02/17/2024 ICM Remote Transmission: Optivol thoracic impedance suggesting normal fluid levels.   Prescribed: No diuretic   Recommendations:  No changes.   Follow-up plan: ICM clinic phone appointment on 05/31/2024.   91 day device clinic remote transmission 07/26/2024.         EP/Cardiology Office Visits:   Recall 04/11/2025 with Dr Almetta.     Copy of ICM check sent to Dr. Waddell.    Remote monitoring is medically necessary for Heart Failure Management.    Daily Thoracic Impedance ICM trend: 01/30/2024 through 04/30/2024.    12-14 Month Thoracic Impedance ICM trend:     [name], RN 04/30/2024 7:59 AM

## 2024-05-02 ENCOUNTER — Ambulatory Visit: Payer: Self-pay | Admitting: Student in an Organized Health Care Education/Training Program

## 2024-05-03 ENCOUNTER — Ambulatory Visit

## 2024-05-31 ENCOUNTER — Ambulatory Visit

## 2024-05-31 DIAGNOSIS — Z95810 Presence of automatic (implantable) cardiac defibrillator: Secondary | ICD-10-CM

## 2024-05-31 DIAGNOSIS — I5022 Chronic systolic (congestive) heart failure: Secondary | ICD-10-CM

## 2024-06-04 NOTE — Progress Notes (Signed)
 EPIC Encounter for ICM Monitoring  Patient Name: [name] is a 78 y.o. female Date: 06/04/2024 Primary Care Physican: System, Provider Not In PPrimary Cardiologist: Waddell Electrophysiologist: Waddell 02/25/2023 Office Weight: 101 lbs 04/16/2024 Office Weight: 108 lbs   Transmission results reviewed.     Since 04/30/2024 ICM Remote Transmission: Optivol thoracic impedance suggesting normal fluid levels.   Prescribed: No diuretic   Recommendations:  No changes.   Follow-up plan: ICM clinic phone appointment on 07/05/2024.   91 day device clinic remote transmission 07/26/2024.         EP/Cardiology Office Visits:   Recall 04/11/2025 with Dr Almetta.     Copy of ICM check sent to Dr. Waddell.     Remote monitoring is medically necessary for Heart Failure Management.    Daily Thoracic Impedance ICM trend: 03/01/2024 through 05/31/2024.    12-14 Month Thoracic Impedance ICM trend:     [name], RN 06/04/2024 8:43 AM

## 2024-07-05 ENCOUNTER — Ambulatory Visit

## 2024-07-05 DIAGNOSIS — I5022 Chronic systolic (congestive) heart failure: Secondary | ICD-10-CM | POA: Diagnosis not present

## 2024-07-05 DIAGNOSIS — Z95810 Presence of automatic (implantable) cardiac defibrillator: Secondary | ICD-10-CM | POA: Diagnosis not present

## 2024-07-07 NOTE — Progress Notes (Signed)
 EPIC Encounter for ICM Monitoring  Patient Name: [name] is a 79 y.o. female Date: 07/07/2024 Primary Care Physican: System, Provider Not In Primary Cardiologist: Almetta Electrophysiologist: Almetta 02/25/2023 Office Weight: 101 lbs 04/16/2024 Office Weight: 108 lbs   Transmission results reviewed.     Since 05/31/2024 ICM Remote Transmission: Optivol thoracic impedance suggesting normal fluid levels.   Prescribed: No diuretic   Recommendations:  No changes.   Follow-up plan: ICM clinic phone appointment on 08/09/2024.   91 day device clinic remote transmission 07/26/2024.         EP/Cardiology Office Visits:   Recall 04/11/2025 with Dr Almetta.     Copy of ICM check sent to Dr. Almetta.    Remote monitoring is medically necessary for Heart Failure Management.    Daily Thoracic Impedance ICM trend: 04/05/2024 through 07/05/2024.    12-14 Month Thoracic Impedance ICM trend:     [name], RN 07/07/2024 5:08 PM

## 2024-07-15 NOTE — Progress Notes (Signed)
 31 day ICM Remote transmission canceled due to [facility] clinic is on hold until further notice.  91 day remote monitoring will continue per protocol.

## 2024-07-26 ENCOUNTER — Ambulatory Visit: Payer: 59

## 2024-08-09 ENCOUNTER — Ambulatory Visit

## 2024-10-25 ENCOUNTER — Ambulatory Visit

## 2025-01-24 ENCOUNTER — Ambulatory Visit

## 2025-04-25 ENCOUNTER — Ambulatory Visit

## 2025-07-25 ENCOUNTER — Ambulatory Visit
